# Patient Record
Sex: Male | Born: 1975 | Race: White | Hispanic: No | Smoking: Never smoker
Health system: Southern US, Community
[De-identification: ages and names within clinical notes are randomized; demographics above are authoritative.]

## PROBLEM LIST (undated history)

## (undated) DIAGNOSIS — I10 Essential (primary) hypertension: Secondary | ICD-10-CM

## (undated) DIAGNOSIS — G4733 Obstructive sleep apnea (adult) (pediatric): Secondary | ICD-10-CM

## (undated) DIAGNOSIS — R519 Headache, unspecified: Secondary | ICD-10-CM

## (undated) DIAGNOSIS — Z9989 Dependence on other enabling machines and devices: Secondary | ICD-10-CM

## (undated) DIAGNOSIS — K219 Gastro-esophageal reflux disease without esophagitis: Secondary | ICD-10-CM

## (undated) DIAGNOSIS — E119 Type 2 diabetes mellitus without complications: Secondary | ICD-10-CM

## (undated) HISTORY — PX: OTHER SURGICAL HISTORY: SHX169

## (undated) HISTORY — DX: Type 2 diabetes mellitus without complications: E11.9

## (undated) HISTORY — DX: Headache, unspecified: R51.9

---

## 2014-03-20 ENCOUNTER — Ambulatory Visit (HOSPITAL_COMMUNITY)
Admission: RE | Admit: 2014-03-20 | Discharge: 2014-03-20 | Disposition: A | Discharge status: Home / Self Care | Payer: BC Managed Care – PPO | Source: Ambulatory Visit | Attending: Family Medicine | Admitting: Family Medicine

## 2014-03-20 ENCOUNTER — Other Ambulatory Visit (HOSPITAL_COMMUNITY): Payer: Self-pay | Admitting: Family Medicine

## 2014-03-20 DIAGNOSIS — J069 Acute upper respiratory infection, unspecified: Secondary | ICD-10-CM

## 2014-03-20 DIAGNOSIS — R05 Cough: Secondary | ICD-10-CM | POA: Diagnosis not present

## 2014-03-20 DIAGNOSIS — R0989 Other specified symptoms and signs involving the circulatory and respiratory systems: Secondary | ICD-10-CM | POA: Diagnosis not present

## 2014-03-20 DIAGNOSIS — R509 Fever, unspecified: Secondary | ICD-10-CM | POA: Insufficient documentation

## 2014-07-14 ENCOUNTER — Emergency Department (HOSPITAL_COMMUNITY): Payer: BC Managed Care – PPO

## 2014-07-14 ENCOUNTER — Encounter (HOSPITAL_COMMUNITY): Payer: Self-pay | Admitting: Family Medicine

## 2014-07-14 ENCOUNTER — Other Ambulatory Visit (HOSPITAL_COMMUNITY): Payer: Self-pay

## 2014-07-14 ENCOUNTER — Observation Stay (HOSPITAL_COMMUNITY)
Admission: EM | Admit: 2014-07-14 | Discharge: 2014-07-15 | Disposition: A | Discharge status: Home / Self Care | Payer: BC Managed Care – PPO | Attending: Internal Medicine | Admitting: Internal Medicine

## 2014-07-14 DIAGNOSIS — K219 Gastro-esophageal reflux disease without esophagitis: Secondary | ICD-10-CM | POA: Insufficient documentation

## 2014-07-14 DIAGNOSIS — Z880 Allergy status to penicillin: Secondary | ICD-10-CM | POA: Insufficient documentation

## 2014-07-14 DIAGNOSIS — Z9981 Dependence on supplemental oxygen: Secondary | ICD-10-CM | POA: Insufficient documentation

## 2014-07-14 DIAGNOSIS — R61 Generalized hyperhidrosis: Secondary | ICD-10-CM | POA: Insufficient documentation

## 2014-07-14 DIAGNOSIS — E876 Hypokalemia: Secondary | ICD-10-CM

## 2014-07-14 DIAGNOSIS — G4733 Obstructive sleep apnea (adult) (pediatric): Secondary | ICD-10-CM | POA: Diagnosis not present

## 2014-07-14 DIAGNOSIS — R55 Syncope and collapse: Secondary | ICD-10-CM | POA: Diagnosis not present

## 2014-07-14 DIAGNOSIS — R42 Dizziness and giddiness: Secondary | ICD-10-CM | POA: Diagnosis present

## 2014-07-14 DIAGNOSIS — Z792 Long term (current) use of antibiotics: Secondary | ICD-10-CM | POA: Insufficient documentation

## 2014-07-14 DIAGNOSIS — R110 Nausea: Secondary | ICD-10-CM | POA: Diagnosis not present

## 2014-07-14 DIAGNOSIS — R739 Hyperglycemia, unspecified: Secondary | ICD-10-CM

## 2014-07-14 DIAGNOSIS — Z79899 Other long term (current) drug therapy: Secondary | ICD-10-CM | POA: Diagnosis not present

## 2014-07-14 DIAGNOSIS — D72829 Elevated white blood cell count, unspecified: Secondary | ICD-10-CM

## 2014-07-14 DIAGNOSIS — I10 Essential (primary) hypertension: Secondary | ICD-10-CM | POA: Diagnosis not present

## 2014-07-14 HISTORY — DX: Dependence on other enabling machines and devices: Z99.89

## 2014-07-14 HISTORY — DX: Essential (primary) hypertension: I10

## 2014-07-14 HISTORY — DX: Obstructive sleep apnea (adult) (pediatric): G47.33

## 2014-07-14 HISTORY — DX: Gastro-esophageal reflux disease without esophagitis: K21.9

## 2014-07-14 LAB — RAPID URINE DRUG SCREEN, HOSP PERFORMED
AMPHETAMINES: NOT DETECTED
Barbiturates: NOT DETECTED
Benzodiazepines: NOT DETECTED
Cocaine: NOT DETECTED
Opiates: NOT DETECTED
Tetrahydrocannabinol: NOT DETECTED

## 2014-07-14 LAB — CBC WITH DIFFERENTIAL/PLATELET
Basophils Absolute: 0.1 10*3/uL (ref 0.0–0.1)
Basophils Relative: 0 % (ref 0–1)
EOS ABS: 0.2 10*3/uL (ref 0.0–0.7)
EOS PCT: 1 % (ref 0–5)
HCT: 45.7 % (ref 39.0–52.0)
HEMOGLOBIN: 15.8 g/dL (ref 13.0–17.0)
LYMPHS ABS: 5.1 10*3/uL — AB (ref 0.7–4.0)
LYMPHS PCT: 27 % (ref 12–46)
MCH: 29.5 pg (ref 26.0–34.0)
MCHC: 34.6 g/dL (ref 30.0–36.0)
MCV: 85.4 fL (ref 78.0–100.0)
MONOS PCT: 9 % (ref 3–12)
Monocytes Absolute: 1.8 10*3/uL — ABNORMAL HIGH (ref 0.1–1.0)
NEUTROS ABS: 11.9 10*3/uL — AB (ref 1.7–7.7)
NEUTROS PCT: 63 % (ref 43–77)
Platelets: 385 10*3/uL (ref 150–400)
RBC: 5.35 MIL/uL (ref 4.22–5.81)
RDW: 13.5 % (ref 11.5–15.5)
WBC: 19.1 10*3/uL — AB (ref 4.0–10.5)

## 2014-07-14 LAB — COMPREHENSIVE METABOLIC PANEL
ALBUMIN: 4.7 g/dL (ref 3.5–5.2)
ALK PHOS: 72 U/L (ref 39–117)
ALT: 84 U/L — ABNORMAL HIGH (ref 0–53)
ANION GAP: 9 (ref 5–15)
AST: 37 U/L (ref 0–37)
BUN: 13 mg/dL (ref 6–23)
CALCIUM: 9.2 mg/dL (ref 8.4–10.5)
CHLORIDE: 106 mmol/L (ref 96–112)
CO2: 22 mmol/L (ref 19–32)
Creatinine, Ser: 1.24 mg/dL (ref 0.50–1.35)
GFR calc Af Amer: 84 mL/min — ABNORMAL LOW (ref >90)
GFR calc non Af Amer: 72 mL/min — ABNORMAL LOW (ref >90)
Glucose, Bld: 153 mg/dL — ABNORMAL HIGH (ref 70–99)
Potassium: 2.8 mmol/L — ABNORMAL LOW (ref 3.5–5.1)
Sodium: 137 mmol/L (ref 135–145)
TOTAL PROTEIN: 7.9 g/dL (ref 6.0–8.3)
Total Bilirubin: 0.5 mg/dL (ref 0.3–1.2)

## 2014-07-14 LAB — URINALYSIS, ROUTINE W REFLEX MICROSCOPIC
BILIRUBIN URINE: NEGATIVE
Glucose, UA: NEGATIVE mg/dL
Hgb urine dipstick: NEGATIVE
KETONES UR: NEGATIVE mg/dL
Leukocytes, UA: NEGATIVE
NITRITE: NEGATIVE
PROTEIN: NEGATIVE mg/dL
Specific Gravity, Urine: 1.02 (ref 1.005–1.030)
Urobilinogen, UA: 1 mg/dL (ref 0.0–1.0)
pH: 7.5 (ref 5.0–8.0)

## 2014-07-14 LAB — LACTIC ACID, PLASMA: LACTIC ACID, VENOUS: 2.1 mmol/L — AB (ref 0.5–2.0)

## 2014-07-14 LAB — TROPONIN I: Troponin I: <0.03 ng/mL (ref <0.031)

## 2014-07-14 MED ORDER — PREDNISONE 20 MG PO TABS: 60.0000 mg | ORAL_TABLET | Freq: Every day | ORAL | Status: DC

## 2014-07-14 MED ORDER — POTASSIUM CHLORIDE CRYS ER 20 MEQ PO TBCR
40.0000 meq | EXTENDED_RELEASE_TABLET | Freq: Four times a day (QID) | ORAL | Status: AC
  Administered 2014-07-14 – 2014-07-15 (×2): 40 meq via ORAL
  Filled 2014-07-14 (×2): qty 2

## 2014-07-14 MED ORDER — PANTOPRAZOLE SODIUM 40 MG PO TBEC: 40.0000 mg | DELAYED_RELEASE_TABLET | Freq: Every day | ORAL | Status: DC

## 2014-07-14 MED ORDER — SODIUM CHLORIDE 0.9 % IJ SOLN
3.0000 mL | Freq: Two times a day (BID) | INTRAMUSCULAR | Status: DC
  Administered 2014-07-15: 3 mL via INTRAVENOUS

## 2014-07-14 MED ORDER — SODIUM CHLORIDE 0.45 % IV SOLN
INTRAVENOUS | Status: DC
  Administered 2014-07-14 – 2014-07-15 (×2): via INTRAVENOUS

## 2014-07-14 MED ORDER — MECLIZINE HCL 12.5 MG PO TABS
25.0000 mg | ORAL_TABLET | Freq: Three times a day (TID) | ORAL | Status: DC | PRN
  Administered 2014-07-15: 25 mg via ORAL
  Filled 2014-07-14: qty 2

## 2014-07-14 MED ORDER — ONDANSETRON HCL 4 MG PO TABS: 4.0000 mg | ORAL_TABLET | Freq: Four times a day (QID) | ORAL | Status: DC | PRN

## 2014-07-14 MED ORDER — PREDNISONE 20 MG PO TABS
60.0000 mg | ORAL_TABLET | Freq: Every day | ORAL | Status: DC
  Administered 2014-07-15: 60 mg via ORAL
  Filled 2014-07-14: qty 3

## 2014-07-14 MED ORDER — ONDANSETRON HCL 4 MG/2ML IJ SOLN: 4.0000 mg | Freq: Four times a day (QID) | INTRAMUSCULAR | Status: DC | PRN

## 2014-07-14 MED ORDER — MECLIZINE HCL 12.5 MG PO TABS
25.0000 mg | ORAL_TABLET | Freq: Once | ORAL | Status: AC
  Administered 2014-07-14: 25 mg via ORAL
  Filled 2014-07-14: qty 2

## 2014-07-14 MED ORDER — ACETAMINOPHEN 325 MG PO TABS: 650.0000 mg | ORAL_TABLET | Freq: Four times a day (QID) | ORAL | Status: DC | PRN

## 2014-07-14 MED ORDER — PANTOPRAZOLE SODIUM 40 MG PO TBEC
40.0000 mg | DELAYED_RELEASE_TABLET | Freq: Every day | ORAL | Status: DC
  Administered 2014-07-15: 40 mg via ORAL
  Filled 2014-07-14: qty 1

## 2014-07-14 MED ORDER — SODIUM CHLORIDE 0.9 % IV BOLUS (SEPSIS)
1000.0000 mL | Freq: Once | INTRAVENOUS | Status: AC
  Administered 2014-07-14: 1000 mL via INTRAVENOUS

## 2014-07-14 MED ORDER — ONDANSETRON HCL 4 MG/2ML IJ SOLN
4.0000 mg | Freq: Once | INTRAMUSCULAR | Status: AC
  Administered 2014-07-14: 4 mg via INTRAVENOUS
  Filled 2014-07-14: qty 2

## 2014-07-14 MED ORDER — HEPARIN SODIUM (PORCINE) 5000 UNIT/ML IJ SOLN
5000.0000 [IU] | Freq: Three times a day (TID) | INTRAMUSCULAR | Status: DC
  Filled 2014-07-14: qty 1

## 2014-07-14 MED ORDER — ACETAMINOPHEN 650 MG RE SUPP: 650.0000 mg | Freq: Four times a day (QID) | RECTAL | Status: DC | PRN

## 2014-07-14 NOTE — ED Provider Notes (Signed)
CSN: [id number]     Arrival date & time 07/14/14  1509 History  This chart was scribed for Francis OctaveStephen Hermina Barnard, MD by Ronney LionSuzanne Le, ED Scribe. This patient was seen in room [facility]10/[facility]10 and the patient's care was started at 3:25 PM.    No chief complaint on file.  The history is provided by the patient and a relative. No language interpreter was used.     HPI Comments: Francis ShipperJames T Morr Jr. is a 39 y.o. male who presents to the Emergency Department complaining of constant, sudden onset room-spinning and light-headed dizziness that occurred at work 1 hour ago when patient was standing from sitting down. He states he also felt hot, sweaty, and nauseated. He states that right now, his left ear "feels weird" which started with this episode and he has some pain in his left sinus area. His symptoms right now feel the same as when they started. Patient went here right after work, and had no treatment PTA. He is on his 3rd day of a Z-pak for sinus issues, per family member; no other recent medication changes. Family reports a history of hypertension, GERD,  hiatal hernia, and asthma. Patient is on lisinopril and a betablocker, per family member. He denies a history of DM. He denies headache or any other pain, abdominal pain, chest pain, visual changes, cough, SOB, or near-syncope. Patient denies drug use or EtOH consumption. Dr. Phillips OdorGolding is patient's PCP. Patient has known allergies to penicillin.   Past Medical History  Diagnosis Date  . GERD (gastroesophageal reflux disease)   . HTN (hypertension)   . OSA on CPAP    Past Surgical History  Procedure Laterality Date  . Wisdom teeth removal      Family History  Problem Relation Age of Onset  . Hypertension Father    History  Substance Use Topics  . Smoking status: Never Smoker   . Smokeless tobacco: Not on file  . Alcohol Use: No    Review of Systems  A complete 10 system review of systems was obtained and all systems are negative except as noted in  the HPI and PMH.    Allergies  Penicillins  Home Medications   Prior to Admission medications   Medication Sig Start Date End Date Taking? Authorizing Provider  azelastine (ASTELIN) 0.1 % nasal spray Place 1 spray into both nostrils 2 (two) times daily. Use in each nostril as directed   Yes Historical Provider, MD  azithromycin (ZITHROMAX) 250 MG tablet Take 250 mg by mouth daily. COURSE STARTING ON 07/10/2013--TO TAKE TWO TABLETS ON DAY 1 THEN TAKE ONE TABLET DAILY FOR 4 DAYS 07/10/14  Yes Historical Provider, MD  ibuprofen (ADVIL,MOTRIN) 200 MG tablet Take 400 mg by mouth every 6 (six) hours as needed for mild pain or moderate pain.   Yes Historical Provider, MD  lisinopril-hydrochlorothiazide (PRINZIDE,ZESTORETIC) 10-12.5 MG per tablet Take 1 tablet by mouth every morning.   Yes Historical Provider, MD  metoprolol succinate (TOPROL-XL) 50 MG 24 hr tablet Take 25 mg by mouth at bedtime. 06/05/14  Yes Historical Provider, MD  Multiple Vitamin (MULTIVITAMIN WITH MINERALS) TABS tablet Take 1 tablet by mouth every evening.   Yes Historical Provider, MD  omeprazole (PRILOSEC) 20 MG capsule Take 20 mg by mouth daily.   Yes Historical Provider, MD  triamcinolone cream (KENALOG) 0.1 % Apply 1 application topically 2 (two) times daily as needed (for psoriasis).   Yes Historical Provider, MD   BP 155/87 mmHg  Pulse 81  Temp(Src)  98.9 F (37.2 C) (Oral)  Resp 16  Ht  (1.651 m)  Wt 264 lb (119.75 kg)  BMI 43.93 kg/m2  SpO2 97% Physical Exam  Constitutional: He is oriented to person, place, and time. He appears well-developed and well-nourished. No distress.  Diaphoretic, uncomfortable.   HENT:  Head: Normocephalic and atraumatic.  Mouth/Throat: Oropharynx is clear and moist. No oropharyngeal exudate.  Eyes: Conjunctivae and EOM are normal. Pupils are equal, round, and reactive to light.  Neck: Normal range of motion. Neck supple.  No meningismus.  Cardiovascular: Normal rate, regular  rhythm, normal heart sounds and intact distal pulses.   No murmur heard. Pulmonary/Chest: Effort normal and breath sounds normal. No respiratory distress.  Abdominal: Soft. There is no tenderness. There is no rebound and no guarding.  Musculoskeletal: Normal range of motion. He exhibits no edema or tenderness.  Neurological: He is alert and oriented to person, place, and time. No cranial nerve deficit. He exhibits normal muscle tone. Coordination normal.  No ataxia on finger to nose bilaterally. No pronator drift. 5/5 strength throughout. CN 2-12 intact. Equal grip strength. Sensation intact. No nystagmus. Test-of-skew negative. Head-impulse test negative.   Skin: Skin is warm.  Psychiatric: He has a normal mood and affect. His behavior is normal.  Nursing note and vitals reviewed.   ED Course  Procedures (including critical care time)  DIAGNOSTIC STUDIES: Oxygen Saturation is 100% on room air, normal by my interpretation.    COORDINATION OF CARE: 3:34 PM - Discussed treatment plan with pt at bedside which includes blood tests and other diagnostic tests, and pt agreed to plan.   Labs Review Labs Reviewed  CBC WITH DIFFERENTIAL/PLATELET - Abnormal; Notable for the following:    WBC 19.1 (*)    Neutro Abs 11.9 (*)    Lymphs Abs 5.1 (*)    Monocytes Absolute 1.8 (*)    All other components within normal limits  COMPREHENSIVE METABOLIC PANEL - Abnormal; Notable for the following:    Potassium 2.8 (*)    Glucose, Bld 153 (*)    ALT 84 (*)    GFR calc non Af Amer 72 (*)    GFR calc Af Amer 84 (*)    All other components within normal limits  LACTIC ACID, PLASMA - Abnormal; Notable for the following:    Lactic Acid, Venous 2.1 (*)    All other components within normal limits  TROPONIN I  URINALYSIS, ROUTINE W REFLEX MICROSCOPIC  URINE RAPID DRUG SCREEN (HOSP PERFORMED)  TROPONIN I  CBC  COMPREHENSIVE METABOLIC PANEL  TROPONIN I  HEMOGLOBIN A1C  LACTIC ACID, PLASMA  CBG  MONITORING, ED  CBG MONITORING, ED    Imaging Review Ct Head Wo Contrast  07/14/2014   CLINICAL DATA:  Sudden onset of lightheadedness, dizziness.  EXAM: CT HEAD WITHOUT CONTRAST  TECHNIQUE: Contiguous axial images were obtained from the base of the skull through the vertex without intravenous contrast.  COMPARISON:  None.  FINDINGS: No acute intracranial abnormality. Specifically, no hemorrhage, hydrocephalus, mass lesion, acute infarction, or significant intracranial injury. No acute calvarial abnormality. Visualized paranasal sinuses and mastoids clear. Orbital soft tissues unremarkable.  IMPRESSION: Negative.   Electronically Signed   By: Charlett Nose M.D.   On: 07/14/2014 16:43   Dg Chest Portable 1 View  07/14/2014   CLINICAL DATA:  39 year old male with diaphoresis nausea, dizziness, syncope. Initial encounter.  EXAM: PORTABLE CHEST - 1 VIEW  COMPARISON:  03/20/2014 and earlier.  FINDINGS: Portable AP supine  view at 1554 hours. Lower lung volumes. Allowing for portable technique, the lungs are clear. Normal cardiac size and mediastinal contours. No pneumothorax or plural effusion identified. Chronic left lateral seventh and eighth rib fractures.  IMPRESSION: No acute cardiopulmonary abnormality.   Electronically Signed   By: Odessa Fleming M.D.   On: 07/14/2014 16:14     EKG Interpretation   Date/Time:  Monday July 14 2014 15:36:28 EST Ventricular Rate:  74 PR Interval:  171 QRS Duration: 110 QT Interval:  423 QTC Calculation: 469 R Axis:   49 Text Interpretation:  Sinus rhythm No previous ECGs available Confirmed by  Manus Gunning  MD, Roxie Kreeger [phone]) on 07/14/2014 3:46:50 PM      MDM   Final diagnoses:  Near syncope  Diaphoresis   acute onset of diaphoresis, nausea, lightheadedness, room spinning dizziness that onset while getting up from a sitting position. No chest pain, back pain or abdominal pain. CBG 134. He denies skipping meals.  EKG normal sinus rhythm.  orthostatics  negative. Chest x-ray negative. Leukocytosis on CBC with recent treatment for URI.   Patient feeling improved after IV fluids, meclizine and Zofran in the ED. His symptoms are reproducible with head movement and sitting up. Continues to deny chest pain. Troponin negative.  Patient with acute onset of near syncope with dizziness and severe diaphoresis. No hypoglycemia. No cardiopulmonary symptoms. Suspect related to peripheral vertigo though patient with severe diaphoresis on arrival. Will admit for observation given initial concerning presentation. D/w Dr. Konrad Dolores.  I personally performed the services described in this documentation, which was scribed in my presence. The recorded information has been reviewed and is accurate.    Francis Octave, MD 07/15/14 [phone]

## 2014-07-14 NOTE — ED Notes (Addendum)
Onset at 2:30 was at work, diaphoretic, nausea, dizziness, pt is is sweating profusely in triage,  And pale, taken back to room to  Complete triage

## 2014-07-14 NOTE — ED Notes (Signed)
Pt tolerated fluids well.  

## 2014-07-14 NOTE — ED Notes (Signed)
CBG 134 at triage.

## 2014-07-14 NOTE — ED Notes (Signed)
Pt diaphoretic in triage, pt taken straight to room.

## 2014-07-14 NOTE — H&P (Signed)
[facility] History and Physical  Francis ShipperJames T Balandran Jr. ZOX:[id number] DOB: 08/27/1975 DOA: 07/14/2014  Referring physician: Dr. Manus Gunningancour - [facility] PCP: Colette RibasGOLDING, [name], MD   Chief Complaint: dizziness  HPI: Francis ShipperJames T Brazie Jr. is a 39 y.o. male  Talking to coworker and developed sudden onset nausea, lightheadedness, diaphoresis. Occurred when stood up to leave room. Went back to desk and sat down. Took > 3.5 hrs to resolve. Rested w/ improvement. Worse w/ sitting, standing or ambulation. Denies syncope chest pain, palpitations, headache, numbness, tingling, unilateral or bilateral weakness.. 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  Started on Azithro last week for Sinusitis/uri/Ear fullness   Review of Systems:  Constitutional:  No weight loss, night sweats, Fevers, chills, fatigue.  HEENT:  Per HPI Cardio-vascular:  No chest pain, Orthopnea, PND, swelling in lower extremities, anasarca, palpitations  GI:  No heartburn, indigestion, abdominal pain, nausea, vomiting, diarrhea, change in bowel habits, loss of appetite  Resp:   No shortness of breath with exertion or at rest. No excess mucus, no productive cough, No non-productive cough, No coughing up of blood.No change in color of mucus.No wheezing.No chest wall deformity  Skin:  no rash or lesions.  GU:  no dysuria, change in color of urine, no urgency or frequency. No flank pain.  Musculoskeletal:   No joint pain or swelling. No decreased range of motion. No back pain.  Psych:  No change in mood or affect. No depression or anxiety. No memory loss.   Past Medical History  Diagnosis Date  . GERD (gastroesophageal reflux disease)   . HTN (hypertension)   . OSA on CPAP    No past surgical history on file. Social History:  reports that he does not drink alcohol or use illicit drugs. His tobacco history is not on file.  Allergies  Allergen Reactions  . Penicillins Hives    Family History  Problem Relation Age of Onset    . Hypertension Father      Prior to Admission medications   Medication Sig Start Date End Date Taking? Authorizing Provider  azelastine (ASTELIN) 0.1 % nasal spray Place 1 spray into both nostrils 2 (two) times daily. Use in each nostril as directed   Yes Historical Provider, MD  azithromycin (ZITHROMAX) 250 MG tablet Take 250 mg by mouth daily. COURSE STARTING ON 07/10/2013--TO TAKE TWO TABLETS ON DAY 1 THEN TAKE ONE TABLET DAILY FOR 4 DAYS 07/10/14  Yes Historical Provider, MD  ibuprofen (ADVIL,MOTRIN) 200 MG tablet Take 400 mg by mouth every 6 (six) hours as needed for mild pain or moderate pain.   Yes Historical Provider, MD  lisinopril-hydrochlorothiazide (PRINZIDE,ZESTORETIC) 10-12.5 MG per tablet Take 1 tablet by mouth every morning.   Yes Historical Provider, MD  metoprolol succinate (TOPROL-XL) 50 MG 24 hr tablet Take 25 mg by mouth at bedtime. 06/05/14  Yes Historical Provider, MD  Multiple Vitamin (MULTIVITAMIN WITH MINERALS) TABS tablet Take 1 tablet by mouth every evening.   Yes Historical Provider, MD  omeprazole (PRILOSEC) 20 MG capsule Take 20 mg by mouth daily.   Yes Historical Provider, MD  triamcinolone cream (KENALOG) 0.1 % Apply 1 application topically 2 (two) times daily as needed (for psoriasis).   Yes Historical Provider, MD   Physical Exam: Filed Vitals:   07/14/14 1850 07/14/14 1900 07/14/14 1915 07/14/14 1930  BP:  127/70 122/68 127/72  Pulse:  77 78 78  Temp:    98.2 F (36.8 C)  TempSrc:    Oral  Resp:  SpO2: 100%   97%    Wt Readings from Last 3 Encounters:  No data found for Wt    General:  Appears calm and comfortable Eyes:  PERRL, normal lids, irises & conjunctiva ENT:  grossly normal hearing, lips & tongue Neck:  no LAD, masses or thyromegaly Cardiovascular:  RRR, no m/r/g. No LE edema. Telemetry:  SR, no arrhythmias  Respiratory:  CTA bilaterally, no w/r/r. Normal respiratory effort. Abdomen:  soft, ntnd Skin:  no rash or induration  seen on limited exam Musculoskeletal:  grossly normal tone BUE/BLE Psychiatric:  grossly normal mood and affect, speech fluent and appropriate Neurologic:  Upon sitting pt became symptomatic which worsened w/ head turning. Dix Hallpike equivocal. CN 2-12 intact. Sits unassisted, moves all extremities in coordinated fashion          Labs on Admission:  Basic Metabolic Panel:  Recent Labs Lab 07/14/14 1530  NA 137  K 2.8*  CL 106  CO2 22  GLUCOSE 153*  BUN 13  CREATININE 1.24  CALCIUM 9.2   Liver Function Tests:  Recent Labs Lab 07/14/14 1530  AST 37  ALT 84*  ALKPHOS 72  BILITOT 0.5  PROT 7.9  ALBUMIN 4.7   No results for input(s): LIPASE, AMYLASE in the last 168 hours. No results for input(s): AMMONIA in the last 168 hours. CBC:  Recent Labs Lab 07/14/14 1530  WBC 19.1*  NEUTROABS 11.9*  HGB 15.8  HCT 45.7  MCV 85.4  PLT 385   Cardiac Enzymes:  Recent Labs Lab 07/14/14 1530  TROPONINI <0.03    BNP (last 3 results) No results for input(s): BNP in the last 8760 hours.  ProBNP (last 3 results) No results for input(s): PROBNP in the last 8760 hours.  CBG: No results for input(s): GLUCAP in the last 168 hours.  Radiological Exams on Admission: Ct Head Wo Contrast  07/14/2014   CLINICAL DATA:  Sudden onset of lightheadedness, dizziness.  EXAM: CT HEAD WITHOUT CONTRAST  TECHNIQUE: Contiguous axial images were obtained from the base of the skull through the vertex without intravenous contrast.  COMPARISON:  None.  FINDINGS: No acute intracranial abnormality. Specifically, no hemorrhage, hydrocephalus, mass lesion, acute infarction, or significant intracranial injury. No acute calvarial abnormality. Visualized paranasal sinuses and mastoids clear. Orbital soft tissues unremarkable.  IMPRESSION: Negative.   Electronically Signed   By: Charlett Nose M.D.   On: 07/14/2014 16:43   Dg Chest Portable 1 View  07/14/2014   CLINICAL DATA:  39 year old male with  diaphoresis nausea, dizziness, syncope. Initial encounter.  EXAM: PORTABLE CHEST - 1 VIEW  COMPARISON:  03/20/2014 and earlier.  FINDINGS: Portable AP supine view at 1554 hours. Lower lung volumes. Allowing for portable technique, the lungs are clear. Normal cardiac size and mediastinal contours. No pneumothorax or plural effusion identified. Chronic left lateral seventh and eighth rib fractures.  IMPRESSION: No acute cardiopulmonary abnormality.   Electronically Signed   By: Odessa Fleming M.D.   On: 07/14/2014 16:14    EKG: Independently reviewed. NSR, no ACS  Assessment/Plan Principal Problem:   Near syncope Active Problems:   Essential hypertension   Hyperglycemia   Leukocytosis   Hypokalemia  Near syncopal episode: labyrinthitis vs arrythmia vs orthostasis vs other infectious process vs hypok. Orthostatics emergency room normal. Symptoms improved w/ 1L NS bolus, zofran and MEclizine. CT head and CXR nml.  - Admit for obs - Tele - 1/2NS 124ml/hr - Meclizine and zofran - cycle  troponin - Prednisone 60  HTN: normotensive on admission - hold Lisinopril, HCTZ, and metoprolol - consider weaning or stopping HCTZ on discharge given symptoms and hypokalemia  GERD: - continue ppi  Hyperglycemia: stress induced vs metabolic disorder - A1c  Leukocytosis: stress induced vs infection. Pt just coming off ABX for URI. No complaints of acute infection. Afebrile VSS. CXR and CT head nml.  - CBC in am - Lactic acid  Hypokalemia: 2.8 on admission. On HCTZ - Kdur x2  Code Status: FULL DVT Prophylaxis: Hep Family Communication: Mother and wife Disposition Plan: pending obs/improvement.     MERRELL, Elmon Else, MD Family Medicine [facility] [email or web address] Password [email or web address]

## 2014-07-14 NOTE — ED Notes (Signed)
Pt ambulated with assitance, walked 25 feet and stated was dizzy, requested to go back to room.

## 2014-07-15 DIAGNOSIS — R42 Dizziness and giddiness: Secondary | ICD-10-CM

## 2014-07-15 LAB — CBC
HEMATOCRIT: 40.7 % (ref 39.0–52.0)
Hemoglobin: 13.9 g/dL (ref 13.0–17.0)
MCH: 29.8 pg (ref 26.0–34.0)
MCHC: 34.2 g/dL (ref 30.0–36.0)
MCV: 87.2 fL (ref 78.0–100.0)
Platelets: 269 10*3/uL (ref 150–400)
RBC: 4.67 MIL/uL (ref 4.22–5.81)
RDW: 13.5 % (ref 11.5–15.5)
WBC: 15.9 10*3/uL — ABNORMAL HIGH (ref 4.0–10.5)

## 2014-07-15 LAB — COMPREHENSIVE METABOLIC PANEL
ALBUMIN: 3.9 g/dL (ref 3.5–5.2)
ALK PHOS: 62 U/L (ref 39–117)
ALT: 72 U/L — ABNORMAL HIGH (ref 0–53)
AST: 28 U/L (ref 0–37)
Anion gap: 5 (ref 5–15)
BUN: 11 mg/dL (ref 6–23)
CHLORIDE: 108 mmol/L (ref 96–112)
CO2: 26 mmol/L (ref 19–32)
CREATININE: 0.97 mg/dL (ref 0.50–1.35)
Calcium: 8.8 mg/dL (ref 8.4–10.5)
GFR calc Af Amer: >90 mL/min (ref >90)
GFR calc non Af Amer: >90 mL/min (ref >90)
Glucose, Bld: 113 mg/dL — ABNORMAL HIGH (ref 70–99)
POTASSIUM: 3.8 mmol/L (ref 3.5–5.1)
Sodium: 139 mmol/L (ref 135–145)
Total Bilirubin: 0.6 mg/dL (ref 0.3–1.2)
Total Protein: 6.7 g/dL (ref 6.0–8.3)

## 2014-07-15 LAB — TROPONIN I

## 2014-07-15 MED ORDER — MECLIZINE HCL 25 MG PO TABS: 25.0000 mg | ORAL_TABLET | Freq: Two times a day (BID) | ORAL | Status: DC | PRN

## 2014-07-15 MED ORDER — POTASSIUM CHLORIDE ER 10 MEQ PO TBCR: 10.0000 meq | EXTENDED_RELEASE_TABLET | Freq: Every day | ORAL | Status: DC

## 2014-07-15 MED ORDER — ONDANSETRON HCL 4 MG PO TABS: 4.0000 mg | ORAL_TABLET | Freq: Four times a day (QID) | ORAL | Status: DC | PRN

## 2014-07-15 NOTE — Progress Notes (Signed)
Patient discharged home with wife.  IV removed - WNL.  Instructed to make follow up appointment in 1 week with PCP.  Reviewed medications and new prescriptions.  Instructed to change positions slowly to prevent dizziness upon standing.  Verbalizes understanding of DC instructions.  STable to DC, no questions at this time.  Ambulatory off unit with RN assist,.

## 2014-07-15 NOTE — Discharge Instructions (Signed)

## 2014-07-15 NOTE — Discharge Summary (Signed)
Physician Discharge Summary  Francis Butler. ZOX:[id number] DOB: [birth year] DOA: 07/14/2014  PCP: Francis Ribas, MD  Admit date: 07/14/2014 Discharge date: 07/15/2014  Recommendations for Outpatient Follow-up:  1. Check CBC and BMP during next office visit.  Discharge Diagnoses:  Principal Problem:   Near syncope Active Problems:   Essential hypertension   Hyperglycemia   Leukocytosis   Hypokalemia    Discharge Condition: stable   Diet recommendation: as tolerated   History of present illness:  39 y.o. male with history of hypertension who presented to [facility] with complaints of near syncope when he attempted to stand up. He reported the feeling as "if room was spinning around". No reports of lightheadedness or weakness. He did reports having chronic sinus and ear congestion (was on azithromycin prior to admission for that). No other complaints of the admission.  Hospital Course:   Principal Problem:   Near syncope / vertigo - likely vertigo based on patient's description of room spinning around - CT head negative for acute intracranial findings. - feels better this am - provided meclizine PRN on discharge   Active Problems:   Essential hypertension - may resume home meds    Leukocytosis - possibly from sinusitis - was on z-pack prior to admission    Hypokalemia - from BP med which has diuretic component  - supplemented    Signed:  Manson Passey, MD  [facility] 07/15/2014, 10:06 AM  Pager #: [phone]    Discharge Exam: Filed Vitals:   07/15/14 0556  BP: 127/69  Pulse: 78  Temp: 98.5 F (36.9 C)  Resp: 20   Filed Vitals:   07/14/14 1915 07/14/14 1930 07/14/14 2057 07/15/14 0556  BP: 122/68 127/72 155/87 127/69  Pulse: 78 78 81 78  Temp:  98.2 F (36.8 C) 98.9 F (37.2 C) 98.5 F (36.9 C)  TempSrc:  Oral Oral Oral  Resp: Height:    (1.651 m)   Weight:   119.75 kg (264 lb)   SpO2:  97% 97% 100%    General:  Pt is alert, follows commands appropriately, not in acute distress Cardiovascular: Regular rate and rhythm, S1/S2 +, no murmurs Respiratory: Clear to auscultation bilaterally, no wheezing, no crackles, no rhonchi Abdominal: Soft, non tender, non distended, bowel sounds +, no guarding Extremities: no edema, no cyanosis, pulses palpable bilaterally DP and PT Neuro: Grossly nonfocal  Discharge Instructions  Discharge Instructions    Call MD for:  difficulty breathing, headache or visual disturbances    Complete by:  As directed      Call MD for:  redness, tenderness, or signs of infection (pain, swelling, redness, odor or green/yellow discharge around incision site)    Complete by:  As directed      Call MD for:  severe uncontrolled pain    Complete by:  As directed      Diet - low sodium heart healthy    Complete by:  As directed      Increase activity slowly    Complete by:  As directed             Medication List    STOP taking these medications        azithromycin 250 MG tablet  Commonly known as:  ZITHROMAX      TAKE these medications        azelastine 0.1 % nasal spray  Commonly known as:  ASTELIN  Place 1 spray into both nostrils  2 (two) times daily. Use in each nostril as directed     ibuprofen 200 MG tablet  Commonly known as:  ADVIL,MOTRIN  Take 400 mg by mouth every 6 (six) hours as needed for mild pain or moderate pain.     lisinopril-hydrochlorothiazide 10-12.5 MG per tablet  Commonly known as:  PRINZIDE,ZESTORETIC  Take 1 tablet by mouth every morning.     meclizine 25 MG tablet  Commonly known as:  ANTIVERT  Take 1 tablet (25 mg total) by mouth 2 (two) times daily as needed for dizziness.     metoprolol succinate 50 MG 24 hr tablet  Commonly known as:  TOPROL-XL  Take 25 mg by mouth at bedtime.     multivitamin with minerals Tabs tablet  Take 1 tablet by mouth every evening.     omeprazole 20 MG capsule  Commonly known as:  PRILOSEC  Take 20 mg  by mouth daily.     ondansetron 4 MG tablet  Commonly known as:  ZOFRAN  Take 1 tablet (4 mg total) by mouth every 6 (six) hours as needed for nausea.     potassium chloride 10 MEQ tablet  Commonly known as:  K-DUR  Take 1 tablet (10 mEq total) by mouth daily.     triamcinolone cream 0.1 %  Commonly known as:  KENALOG  Apply 1 application topically 2 (two) times daily as needed (for psoriasis).           Follow-up Information    Follow up with Francis Ribas, MD. Schedule an appointment as soon as possible for a visit in 1 week.   Specialty:  Family Medicine   Why:  Follow up appt after recent hospitalization   Contact information:   [address] [place] [address] [phone]        The results of significant diagnostics from this hospitalization (including imaging, microbiology, ancillary and laboratory) are listed below for reference.    Significant Diagnostic Studies: Ct Head Wo Contrast  07/14/2014   CLINICAL DATA:  Sudden onset of lightheadedness, dizziness.  EXAM: CT HEAD WITHOUT CONTRAST  TECHNIQUE: Contiguous axial images were obtained from the base of the skull through the vertex without intravenous contrast.  COMPARISON:  None.  FINDINGS: No acute intracranial abnormality. Specifically, no hemorrhage, hydrocephalus, mass lesion, acute infarction, or significant intracranial injury. No acute calvarial abnormality. Visualized paranasal sinuses and mastoids clear. Orbital soft tissues unremarkable.  IMPRESSION: Negative.   Electronically Signed   By: Francis Butler M.D.   On: 07/14/2014 16:43   Dg Chest Portable 1 View  07/14/2014   CLINICAL DATA:  39 year old male with diaphoresis nausea, dizziness, syncope. Initial encounter.  EXAM: PORTABLE CHEST - 1 VIEW  COMPARISON:  03/20/2014 and earlier.  FINDINGS: Portable AP supine view at 1554 hours. Lower lung volumes. Allowing for portable technique, the lungs are clear. Normal cardiac size and mediastinal  contours. No pneumothorax or plural effusion identified. Chronic left lateral seventh and eighth rib fractures.  IMPRESSION: No acute cardiopulmonary abnormality.   Electronically Signed   By: Francis Butler M.D.   On: 07/14/2014 16:14    Microbiology: No results found for this or any previous visit (from the past 240 hour(s)).   Labs: Basic Metabolic Panel:  Recent Labs Lab 07/14/14 1530 07/15/14 0158  NA 137 139  K 2.8* 3.8  CL 106 108  CO2 22 26  GLUCOSE 153* 113*  BUN 13 11  CREATININE 1.24 0.97  CALCIUM 9.2 8.8  Liver Function Tests:  Recent Labs Lab 07/14/14 1530 07/15/14 0158  AST 37 28  ALT 84* 72*  ALKPHOS 72 62  BILITOT 0.5 0.6  PROT 7.9 6.7  ALBUMIN 4.7 3.9   No results for input(s): LIPASE, AMYLASE in the last 168 hours. No results for input(s): AMMONIA in the last 168 hours. CBC:  Recent Labs Lab 07/14/14 1530 07/15/14 0158  WBC 19.1* 15.9*  NEUTROABS 11.9*  --   HGB 15.8 13.9  HCT 45.7 40.7  MCV 85.4 87.2  PLT 385 269   Cardiac Enzymes:  Recent Labs Lab 07/14/14 1530 07/14/14 2039 07/15/14 0158  TROPONINI <0.03 <0.03 <0.03   BNP: BNP (last 3 results) No results for input(s): BNP in the last 8760 hours.  ProBNP (last 3 results) No results for input(s): PROBNP in the last 8760 hours.  CBG: No results for input(s): GLUCAP in the last 168 hours.  Time coordinating discharge: Over 30 minutes

## 2014-07-15 NOTE — Progress Notes (Signed)
UR completed 

## 2014-07-16 LAB — GLUCOSE, CAPILLARY: GLUCOSE-CAPILLARY: 134 mg/dL — AB (ref 70–99)

## 2014-07-16 LAB — HEMOGLOBIN A1C
Hgb A1c MFr Bld: 5.7 % — ABNORMAL HIGH (ref 4.8–5.6)
Mean Plasma Glucose: 117 mg/dL

## 2014-10-15 ENCOUNTER — Other Ambulatory Visit (INDEPENDENT_AMBULATORY_CARE_PROVIDER_SITE_OTHER): Payer: Self-pay | Admitting: Otolaryngology

## 2014-10-15 DIAGNOSIS — R42 Dizziness and giddiness: Secondary | ICD-10-CM

## 2014-10-15 DIAGNOSIS — H918X9 Other specified hearing loss, unspecified ear: Secondary | ICD-10-CM

## 2014-10-28 ENCOUNTER — Ambulatory Visit
Admission: RE | Admit: 2014-10-28 | Discharge: 2014-10-28 | Disposition: A | Discharge status: Home / Self Care | Payer: BC Managed Care – PPO | Source: Ambulatory Visit | Attending: Otolaryngology | Admitting: Otolaryngology

## 2014-10-28 DIAGNOSIS — R42 Dizziness and giddiness: Secondary | ICD-10-CM

## 2014-10-28 DIAGNOSIS — H918X9 Other specified hearing loss, unspecified ear: Secondary | ICD-10-CM

## 2014-10-28 MED ORDER — GADOBENATE DIMEGLUMINE 529 MG/ML IV SOLN
20.0000 mL | Freq: Once | INTRAVENOUS | Status: AC | PRN
  Administered 2014-10-28: 20 mL via INTRAVENOUS

## 2014-10-28 MED ORDER — GADOBENATE DIMEGLUMINE 529 MG/ML IV SOLN: 20.0000 mL | Freq: Once | INTRAVENOUS | Status: DC | PRN

## 2014-11-27 ENCOUNTER — Ambulatory Visit (INDEPENDENT_AMBULATORY_CARE_PROVIDER_SITE_OTHER): Payer: BC Managed Care – PPO | Admitting: Otolaryngology

## 2015-03-12 ENCOUNTER — Ambulatory Visit (INDEPENDENT_AMBULATORY_CARE_PROVIDER_SITE_OTHER): Payer: BC Managed Care – PPO | Admitting: Otolaryngology

## 2015-03-12 DIAGNOSIS — H8101 Meniere's disease, right ear: Secondary | ICD-10-CM | POA: Diagnosis not present

## 2015-03-12 DIAGNOSIS — H9311 Tinnitus, right ear: Secondary | ICD-10-CM | POA: Diagnosis not present

## 2015-03-12 DIAGNOSIS — H9041 Sensorineural hearing loss, unilateral, right ear, with unrestricted hearing on the contralateral side: Secondary | ICD-10-CM | POA: Diagnosis not present

## 2019-08-05 ENCOUNTER — Other Ambulatory Visit: Payer: Self-pay

## 2019-08-05 ENCOUNTER — Ambulatory Visit: Payer: BC Managed Care – PPO | Attending: Family Medicine

## 2019-08-05 DIAGNOSIS — Z20822 Contact with and (suspected) exposure to covid-19: Secondary | ICD-10-CM

## 2019-08-06 LAB — NOVEL CORONAVIRUS, NAA: SARS-CoV-2, NAA: NOT DETECTED

## 2019-10-02 ENCOUNTER — Other Ambulatory Visit: Payer: Self-pay

## 2019-10-02 ENCOUNTER — Encounter: Payer: Self-pay | Admitting: Neurology

## 2019-10-02 ENCOUNTER — Ambulatory Visit: Payer: BC Managed Care – PPO | Admitting: Neurology

## 2019-10-02 VITALS — BP 142/93 | HR 112 | Temp 97.2°F | Ht 65.0 in | Wt 249.0 lb

## 2019-10-02 DIAGNOSIS — R42 Dizziness and giddiness: Secondary | ICD-10-CM

## 2019-10-02 DIAGNOSIS — G43519 Persistent migraine aura without cerebral infarction, intractable, without status migrainosus: Secondary | ICD-10-CM | POA: Insufficient documentation

## 2019-10-02 MED ORDER — INDOMETHACIN ER 75 MG PO CPCR: 75.0000 mg | ORAL_CAPSULE | Freq: Two times a day (BID) | ORAL | 1 refills | Status: DC

## 2019-10-02 NOTE — Progress Notes (Signed)
Reason for visit: Facial pain, vertigo  Referring physician: Dr. Carrington Clamp T Francis Butler. is a 44 y.o. male  History of present illness:  Francis Butler is a 44 year old right-handed white male with a 5-year history of onset of vertigo associated with low-frequency hearing deficit.  The patient was seen by Dr. Benjamine Mola initially from ENT and was diagnosed with Mnire's disease.  The patient however claims at the same time of onset with the vertigo, he began to have some persistent dull achy pain in the V2 distribution of the right face, but he has some tenderness to light touch throughout the entire right side of the head, he also has developed some chronic discomfort of the right neck and shoulder.  He is getting massage therapy on a regular basis with some benefit.  The patient eventually was seen by Dr. Azucena Cecil from ENT, the patient underwent a right mastoidectomy, he has had multiple injections of dexamethasone into the endolymph of the ear without benefit.  He has been on hydrochlorothiazide and low-salt diet.  He continues to have episodes of vertigo and hearing change.  The patient indicates that with his exacerbations of his Mnire's symptoms, he also gets increased pain in the right face.  He has noted that loud noises or chemical odors will bring on attacks.  For this reason, the patient was referred to Dr. Joretta Bachelor from neurology for treatment of possible migraine headaches.  The patient has been treated with Vivactil, verapamil, Topamax, Ajovy, and Ubrelvy.  He has taken Maxalt for the headache with minimal benefit.  Trials on oral steroids have transiently offered some benefit but as soon as the medication is stopped, the pain resumes.  The patient denies any increased pain with chewing or swallowing or talking.  He has no weakness but feels "weak all over".  He does get some blurred vision with the increased discomfort at times.  He denies issues controlling the bowels or the bladder, he does  have chronic vertigo and some gait instability, he is no longer driving.  He has had MRI of the brain done on 30 August 2017, this was unremarkable.  The comes to this office for further evaluation.  With the exacerbations of the vertigo, he also has some difficulty with focusing and concentration.  Past Medical History:  Diagnosis Date  . Diabetes mellitus without complication ([place])   . GERD (gastroesophageal reflux disease)   . Headache   . HTN (hypertension)   . OSA on CPAP     Past Surgical History:  Procedure Laterality Date  . wisdom teeth removal       Family History  Problem Relation Age of Onset  . Hypertension Father     Social history:  reports that he has never smoked. He has never used smokeless tobacco. He reports that he does not drink alcohol or use drugs.  Medications:  Prior to Admission medications   Medication Sig Start Date End Date Taking? Authorizing Provider  cyclobenzaprine (FLEXERIL) 10 MG tablet TAKE 1 TABLET BY MOUTH THREE TIMES DAILY AS NEEDED 09/14/15  Yes [provider]  diazepam (VALIUM) 10 MG tablet TAKE 1 TABLET BY MOUTH EVERY 6 HOURS AS NEEDED 09/13/19  Yes [provider]  Fremanezumab-vfrm (AJOVY) 225 MG/1.5ML SOSY INJECT 1.5 MLS INTO THE SKIN EVERY 30 DAYS 06/20/19  Yes [provider]  ibuprofen (ADVIL,MOTRIN) 200 MG tablet Take 400 mg by mouth every 6 (six) hours as needed for mild pain or moderate pain.  Yes [provider]  lisinopril (ZESTRIL) 10 MG tablet Take by mouth. 09/19/16  Yes [provider]  MELATONIN PO Take by mouth.   Yes [provider]  metoCLOPramide (REGLAN) 10 MG tablet Take by mouth. 07/23/19 07/23/20 Yes [provider]  omeprazole (PRILOSEC) 20 MG capsule Take 20 mg by mouth daily.   Yes [provider]  ondansetron (ZOFRAN) 4 MG tablet Take 1 tablet (4 mg total) by mouth every 6 (six) hours as needed for nausea. 07/15/14  Yes Alison Murray, MD   ondansetron (ZOFRAN-ODT) 8 MG disintegrating tablet TAKE 1 TABLET BY MOUTH EVERY 8 HOURS AS NEEDED FOR NAUSEA FOR UP TO 7 DAYS 10/02/18  Yes [provider]  protriptyline (VIVACTIL) 10 MG tablet Take 15 mg by mouth every morning. 09/23/19  Yes [provider]  rizatriptan (MAXALT) 10 MG tablet Take by mouth. 10/10/18  Yes [provider]  triamcinolone cream (KENALOG) 0.1 % Apply 1 application topically 2 (two) times daily as needed (for psoriasis).   Yes [provider]      Allergies  Allergen Reactions  . Bee Pollen Swelling  . Scopolamine Other (See Comments)    Pupils to dilate  . Penicillins Hives    ROS:  Out of a complete 14 system review of symptoms, the patient complains only of the following symptoms, and all other reviewed systems are negative.  Headache, neck pain, vertigo Weight loss  Blood pressure (!) 142/93, pulse (!) 112, temperature (!) 97.2 F (36.2 C), height 5\' 5"  (1.651 m), weight 249 lb (112.9 kg).  Physical Exam  General: The patient is alert and cooperative at the time of the examination.  The patient is markedly obese.  Eyes: Pupils are equal, round, and reactive to light. Discs are flat bilaterally.  Neck: The neck is supple, no carotid bruits are noted.  Respiratory: The respiratory examination is clear.  Cardiovascular: The cardiovascular examination reveals a regular rate and rhythm, no obvious murmurs or rubs are noted.  Skin: Extremities are without significant edema.  Neurologic Exam  Mental status: The patient is alert and oriented x 3 at the time of the examination. The patient has apparent normal recent and remote memory, with an apparently normal attention span and concentration ability.  Cranial nerves: Facial symmetry is present. There is good sensation of the face to pinprick and soft touch bilaterally.  The patient is more sensitive to pinprick and soft touch on the right face as compared to the  left.  The strength of the facial muscles and the muscles to head turning and shoulder shrug are normal bilaterally. Speech is well enunciated, no aphasia or dysarthria is noted. Extraocular movements are full. Visual fields are full. The tongue is midline, and the patient has symmetric elevation of the soft palate. No obvious hearing deficits are noted.  Motor: The motor testing reveals 5 over 5 strength of all 4 extremities. Good symmetric motor tone is noted throughout.  Sensory: Sensory testing is intact to pinprick, soft touch, vibration sensation, and position sense on all 4 extremities. No evidence of extinction is noted.  Coordination: Cerebellar testing reveals good finger-nose-finger and heel-to-shin bilaterally.  Gait and station: Gait is normal. Tandem gait is unsteady. Romberg is negative. No drift is seen.  Reflexes: Deep tendon reflexes are symmetric and normal bilaterally. Toes are downgoing bilaterally.   MRI brain 08/30/17:  CONCLUSION:  1.  No acute intracranial abnormality.  2.  Similar post-surgical changes of prior right mastoidectomy.  Assessment/Plan:  1.  Atypical facial pain, right  2.  Vertigo, previous diagnosis of Mnire's disease  The patient has an unusual symptom complex.  He had onset of what sounds like Mnire's disease with vertigo associated with low-frequency hearing deficit.  However, at the same time he had onset of right facial discomfort that has been persistent and some discomfort into the right neck and shoulder.  The pain has been ongoing, and has not responded to typical migraine therapy.  The pain including the vertigo seems to be worsened by chemical odors and loud noises.  These are migrainous features.  The patient will be given a brief trial on indomethacin to exclude a sympathetic mediated headache syndrome, if no benefit after 3 weeks he is to contact our office and I will initiate treatment with gabapentin and try to get him started  on Botox therapy.  If these treatments do not help, then I will refer him to a neurologist who specializes in head and neck pain syndromes.  He will follow-up otherwise in about 4 months.   Francis Palau MD 10/02/2019 12:07 PM  [facility] [address] [place], [place] [address]  Phone [phone] Fax [phone]

## 2019-10-02 NOTE — Patient Instructions (Signed)
Stop the Ajovy, start Indomethacin 75 mg twice a day for at least 3 weeks, call if there is no benefit.

## 2019-10-17 ENCOUNTER — Other Ambulatory Visit: Payer: Self-pay | Admitting: Neurology

## 2019-10-17 DIAGNOSIS — G43711 Chronic migraine without aura, intractable, with status migrainosus: Secondary | ICD-10-CM

## 2019-10-17 MED ORDER — GABAPENTIN 300 MG PO CAPS: ORAL_CAPSULE | ORAL | 3 refills | Status: DC

## 2019-10-22 ENCOUNTER — Telehealth: Payer: Self-pay | Admitting: Neurology

## 2019-10-22 NOTE — Telephone Encounter (Signed)
I called [employer] [phone]) and spoke with Chanetta Marshall. She states code [phone] requires PA, but [phone] does not. She transferred me to the pharmacy and I spoke with Lynette C. She states she will fax the PA form over for Botox.

## 2019-10-22 NOTE — Telephone Encounter (Signed)
I received the form via fax. I filled it out and gave it to the work-in MD to sign because Dr. Anne Hahn is out of office. MD signed and I faxed back to [facility] [phone]). Awaiting response.

## 2019-11-05 NOTE — Telephone Encounter (Signed)
I hadn't received a response to my PA request, so I called [employer]. They stated they never received the PA and advised me to fax it again. I faxed it back to [phone].

## 2019-11-06 NOTE — Telephone Encounter (Signed)
Received PA approval from [facility] via fax. PA #[id number]. Valid 11/05/19 to 05/02/20.

## 2019-11-20 ENCOUNTER — Other Ambulatory Visit: Payer: Self-pay

## 2019-11-20 ENCOUNTER — Encounter (HOSPITAL_COMMUNITY): Payer: Self-pay | Admitting: Emergency Medicine

## 2019-11-20 ENCOUNTER — Inpatient Hospital Stay (HOSPITAL_COMMUNITY)
Admission: EM | Admit: 2019-11-20 | Discharge: 2019-11-24 | DRG: 372 | Disposition: A | Discharge status: Home / Self Care | Payer: BC Managed Care – PPO | Attending: Internal Medicine | Admitting: Internal Medicine

## 2019-11-20 DIAGNOSIS — K219 Gastro-esophageal reflux disease without esophagitis: Secondary | ICD-10-CM | POA: Diagnosis present

## 2019-11-20 DIAGNOSIS — N179 Acute kidney failure, unspecified: Secondary | ICD-10-CM | POA: Diagnosis present

## 2019-11-20 DIAGNOSIS — E669 Obesity, unspecified: Secondary | ICD-10-CM | POA: Diagnosis present

## 2019-11-20 DIAGNOSIS — G43809 Other migraine, not intractable, without status migrainosus: Secondary | ICD-10-CM | POA: Diagnosis present

## 2019-11-20 DIAGNOSIS — Z8249 Family history of ischemic heart disease and other diseases of the circulatory system: Secondary | ICD-10-CM

## 2019-11-20 DIAGNOSIS — E860 Dehydration: Secondary | ICD-10-CM | POA: Diagnosis not present

## 2019-11-20 DIAGNOSIS — A020 Salmonella enteritis: Secondary | ICD-10-CM | POA: Diagnosis not present

## 2019-11-20 DIAGNOSIS — Z880 Allergy status to penicillin: Secondary | ICD-10-CM

## 2019-11-20 DIAGNOSIS — A045 Campylobacter enteritis: Secondary | ICD-10-CM | POA: Diagnosis present

## 2019-11-20 DIAGNOSIS — R42 Dizziness and giddiness: Secondary | ICD-10-CM

## 2019-11-20 DIAGNOSIS — R509 Fever, unspecified: Secondary | ICD-10-CM

## 2019-11-20 DIAGNOSIS — Z20822 Contact with and (suspected) exposure to covid-19: Secondary | ICD-10-CM | POA: Diagnosis present

## 2019-11-20 DIAGNOSIS — K529 Noninfective gastroenteritis and colitis, unspecified: Secondary | ICD-10-CM

## 2019-11-20 DIAGNOSIS — I10 Essential (primary) hypertension: Secondary | ICD-10-CM | POA: Diagnosis present

## 2019-11-20 DIAGNOSIS — Z6841 Body Mass Index (BMI) 40.0 and over, adult: Secondary | ICD-10-CM

## 2019-11-20 DIAGNOSIS — G4733 Obstructive sleep apnea (adult) (pediatric): Secondary | ICD-10-CM | POA: Diagnosis present

## 2019-11-20 LAB — CBC
HCT: 45.9 % (ref 39.0–52.0)
Hemoglobin: 15.3 g/dL (ref 13.0–17.0)
MCH: 28.5 pg (ref 26.0–34.0)
MCHC: 33.3 g/dL (ref 30.0–36.0)
MCV: 85.5 fL (ref 80.0–100.0)
Platelets: 278 10*3/uL (ref 150–400)
RBC: 5.37 MIL/uL (ref 4.22–5.81)
RDW: 13.8 % (ref 11.5–15.5)
WBC: 15.7 10*3/uL — ABNORMAL HIGH (ref 4.0–10.5)
nRBC: 0 % (ref 0.0–0.2)

## 2019-11-20 LAB — URINALYSIS, ROUTINE W REFLEX MICROSCOPIC
Bilirubin Urine: NEGATIVE
Glucose, UA: NEGATIVE mg/dL
Hgb urine dipstick: NEGATIVE
Ketones, ur: NEGATIVE mg/dL
Leukocytes,Ua: NEGATIVE
Nitrite: NEGATIVE
Protein, ur: NEGATIVE mg/dL
Specific Gravity, Urine: 1.002 — ABNORMAL LOW (ref 1.005–1.030)
pH: 7 (ref 5.0–8.0)

## 2019-11-20 LAB — LIPASE, BLOOD: Lipase: 26 U/L (ref 11–51)

## 2019-11-20 LAB — COMPREHENSIVE METABOLIC PANEL
ALT: 24 U/L (ref 0–44)
AST: 19 U/L (ref 15–41)
Albumin: 4 g/dL (ref 3.5–5.0)
Alkaline Phosphatase: 61 U/L (ref 38–126)
Anion gap: 10 (ref 5–15)
BUN: 9 mg/dL (ref 6–20)
CO2: 23 mmol/L (ref 22–32)
Calcium: 8.8 mg/dL — ABNORMAL LOW (ref 8.9–10.3)
Chloride: 103 mmol/L (ref 98–111)
Creatinine, Ser: 1.49 mg/dL — ABNORMAL HIGH (ref 0.61–1.24)
GFR calc Af Amer: >60 mL/min (ref >60)
GFR calc non Af Amer: 56 mL/min — ABNORMAL LOW (ref >60)
Glucose, Bld: 109 mg/dL — ABNORMAL HIGH (ref 70–99)
Potassium: 3.7 mmol/L (ref 3.5–5.1)
Sodium: 136 mmol/L (ref 135–145)
Total Bilirubin: 0.8 mg/dL (ref 0.3–1.2)
Total Protein: 6.6 g/dL (ref 6.5–8.1)

## 2019-11-20 MED ORDER — SODIUM CHLORIDE 0.9% FLUSH: 3.0000 mL | Freq: Once | INTRAVENOUS | Status: DC

## 2019-11-20 NOTE — ED Triage Notes (Addendum)
Pt c/o fever, nausea/vomiting, right ear pain and dizziness that started on Sunday.

## 2019-11-21 ENCOUNTER — Emergency Department (HOSPITAL_COMMUNITY): Payer: BC Managed Care – PPO

## 2019-11-21 ENCOUNTER — Other Ambulatory Visit: Payer: Self-pay | Admitting: Neurology

## 2019-11-21 ENCOUNTER — Inpatient Hospital Stay (HOSPITAL_COMMUNITY): Payer: BC Managed Care – PPO

## 2019-11-21 DIAGNOSIS — A045 Campylobacter enteritis: Secondary | ICD-10-CM | POA: Diagnosis present

## 2019-11-21 DIAGNOSIS — R509 Fever, unspecified: Secondary | ICD-10-CM | POA: Diagnosis not present

## 2019-11-21 DIAGNOSIS — G43809 Other migraine, not intractable, without status migrainosus: Secondary | ICD-10-CM | POA: Diagnosis present

## 2019-11-21 DIAGNOSIS — R42 Dizziness and giddiness: Secondary | ICD-10-CM | POA: Diagnosis not present

## 2019-11-21 DIAGNOSIS — Z20822 Contact with and (suspected) exposure to covid-19: Secondary | ICD-10-CM | POA: Diagnosis present

## 2019-11-21 DIAGNOSIS — N179 Acute kidney failure, unspecified: Secondary | ICD-10-CM | POA: Diagnosis present

## 2019-11-21 DIAGNOSIS — E860 Dehydration: Secondary | ICD-10-CM | POA: Diagnosis present

## 2019-11-21 DIAGNOSIS — K219 Gastro-esophageal reflux disease without esophagitis: Secondary | ICD-10-CM | POA: Diagnosis present

## 2019-11-21 DIAGNOSIS — I10 Essential (primary) hypertension: Secondary | ICD-10-CM | POA: Diagnosis present

## 2019-11-21 DIAGNOSIS — K529 Noninfective gastroenteritis and colitis, unspecified: Secondary | ICD-10-CM | POA: Diagnosis not present

## 2019-11-21 DIAGNOSIS — Z6841 Body Mass Index (BMI) 40.0 and over, adult: Secondary | ICD-10-CM | POA: Diagnosis not present

## 2019-11-21 DIAGNOSIS — Z880 Allergy status to penicillin: Secondary | ICD-10-CM | POA: Diagnosis not present

## 2019-11-21 DIAGNOSIS — E669 Obesity, unspecified: Secondary | ICD-10-CM | POA: Diagnosis present

## 2019-11-21 DIAGNOSIS — Z8249 Family history of ischemic heart disease and other diseases of the circulatory system: Secondary | ICD-10-CM | POA: Diagnosis not present

## 2019-11-21 DIAGNOSIS — A020 Salmonella enteritis: Secondary | ICD-10-CM | POA: Diagnosis present

## 2019-11-21 DIAGNOSIS — G4733 Obstructive sleep apnea (adult) (pediatric): Secondary | ICD-10-CM | POA: Diagnosis present

## 2019-11-21 LAB — SARS CORONAVIRUS 2 BY RT PCR (HOSPITAL ORDER, PERFORMED IN ~~LOC~~ HOSPITAL LAB): SARS Coronavirus 2: NEGATIVE

## 2019-11-21 LAB — LACTIC ACID, PLASMA: Lactic Acid, Venous: 1.2 mmol/L (ref 0.5–1.9)

## 2019-11-21 LAB — SEDIMENTATION RATE: Sed Rate: 30 mm/hr — ABNORMAL HIGH (ref 0–16)

## 2019-11-21 LAB — C-REACTIVE PROTEIN: CRP: 12.6 mg/dL — ABNORMAL HIGH (ref <1.0)

## 2019-11-21 LAB — PROCALCITONIN: Procalcitonin: 0.29 ng/mL

## 2019-11-21 MED ORDER — SUMATRIPTAN SUCCINATE 50 MG PO TABS
50.0000 mg | ORAL_TABLET | ORAL | Status: DC | PRN
  Filled 2019-11-21: qty 1

## 2019-11-21 MED ORDER — LORAZEPAM 0.5 MG PO TABS: 0.5000 mg | ORAL_TABLET | Freq: Four times a day (QID) | ORAL | Status: DC | PRN

## 2019-11-21 MED ORDER — IOHEXOL 350 MG/ML SOLN
100.0000 mL | Freq: Once | INTRAVENOUS | Status: AC | PRN
  Administered 2019-11-21: 100 mL via INTRAVENOUS

## 2019-11-21 MED ORDER — LACTATED RINGERS IV BOLUS
1000.0000 mL | Freq: Once | INTRAVENOUS | Status: AC
  Administered 2019-11-21: 1000 mL via INTRAVENOUS

## 2019-11-21 MED ORDER — HYDRALAZINE HCL 25 MG PO TABS: 25.0000 mg | ORAL_TABLET | Freq: Four times a day (QID) | ORAL | Status: DC | PRN

## 2019-11-21 MED ORDER — CYCLOBENZAPRINE HCL 10 MG PO TABS
10.0000 mg | ORAL_TABLET | Freq: Three times a day (TID) | ORAL | Status: DC | PRN
  Administered 2019-11-21 – 2019-11-23 (×3): 10 mg via ORAL
  Filled 2019-11-21 (×3): qty 1

## 2019-11-21 MED ORDER — ONDANSETRON 4 MG PO TBDP
8.0000 mg | ORAL_TABLET | Freq: Three times a day (TID) | ORAL | Status: DC | PRN
  Administered 2019-11-21 – 2019-11-22 (×3): 8 mg via ORAL
  Filled 2019-11-21 (×4): qty 2

## 2019-11-21 MED ORDER — B2 100 MG PO TABS: 100.0000 mg | ORAL_TABLET | Freq: Every day | ORAL | Status: DC

## 2019-11-21 MED ORDER — SODIUM CHLORIDE 0.9 % IV SOLN
2.0000 g | Freq: Every day | INTRAVENOUS | Status: DC
  Administered 2019-11-21 – 2019-11-22 (×2): 2 g via INTRAVENOUS
  Filled 2019-11-21 (×2): qty 20

## 2019-11-21 MED ORDER — METRONIDAZOLE IN NACL 5-0.79 MG/ML-% IV SOLN
500.0000 mg | Freq: Three times a day (TID) | INTRAVENOUS | Status: DC
  Administered 2019-11-22 (×2): 500 mg via INTRAVENOUS
  Filled 2019-11-21 (×2): qty 100

## 2019-11-21 MED ORDER — MELATONIN 5 MG PO TABS
5.0000 mg | ORAL_TABLET | Freq: Every day | ORAL | Status: DC
  Administered 2019-11-21 – 2019-11-23 (×3): 5 mg via ORAL
  Filled 2019-11-21 (×4): qty 1

## 2019-11-21 MED ORDER — ONDANSETRON HCL 4 MG/2ML IJ SOLN
4.0000 mg | Freq: Once | INTRAMUSCULAR | Status: AC
  Administered 2019-11-21: 4 mg via INTRAVENOUS
  Filled 2019-11-21: qty 2

## 2019-11-21 MED ORDER — ACETAMINOPHEN 325 MG PO TABS
650.0000 mg | ORAL_TABLET | Freq: Four times a day (QID) | ORAL | Status: DC | PRN
  Administered 2019-11-21 – 2019-11-23 (×4): 650 mg via ORAL
  Filled 2019-11-21 (×4): qty 2

## 2019-11-21 MED ORDER — DIAZEPAM 5 MG PO TABS
5.0000 mg | ORAL_TABLET | Freq: Four times a day (QID) | ORAL | 1 refills | Status: DC | PRN
Start: 2019-11-21 — End: 2020-01-08

## 2019-11-21 MED ORDER — GABAPENTIN 300 MG PO CAPS
300.0000 mg | ORAL_CAPSULE | Freq: Three times a day (TID) | ORAL | Status: DC
  Administered 2019-11-21 – 2019-11-24 (×9): 300 mg via ORAL
  Filled 2019-11-21 (×9): qty 1

## 2019-11-21 MED ORDER — ONDANSETRON 8 MG PO TBDP: 8.0000 mg | ORAL_TABLET | Freq: Three times a day (TID) | ORAL | 3 refills | Status: DC | PRN

## 2019-11-21 MED ORDER — MAGNESIUM OXIDE 400 (241.3 MG) MG PO TABS
400.0000 mg | ORAL_TABLET | Freq: Every day | ORAL | Status: DC
  Administered 2019-11-21 – 2019-11-24 (×4): 400 mg via ORAL
  Filled 2019-11-21 (×4): qty 1

## 2019-11-21 MED ORDER — PANTOPRAZOLE SODIUM 40 MG PO TBEC
40.0000 mg | DELAYED_RELEASE_TABLET | Freq: Every day | ORAL | Status: DC
  Administered 2019-11-21 – 2019-11-24 (×4): 40 mg via ORAL
  Filled 2019-11-21 (×4): qty 1

## 2019-11-21 MED ORDER — IBUPROFEN 400 MG PO TABS
400.0000 mg | ORAL_TABLET | Freq: Four times a day (QID) | ORAL | Status: DC | PRN
Start: 2019-11-21 — End: 2019-11-21

## 2019-11-21 MED ORDER — VERAPAMIL HCL ER 180 MG PO TBCR
180.0000 mg | EXTENDED_RELEASE_TABLET | Freq: Every evening | ORAL | Status: DC
  Administered 2019-11-21 – 2019-11-23 (×3): 180 mg via ORAL
  Filled 2019-11-21 (×5): qty 1

## 2019-11-21 MED ORDER — DIAZEPAM 5 MG PO TABS
5.0000 mg | ORAL_TABLET | Freq: Four times a day (QID) | ORAL | Status: DC | PRN
  Administered 2019-11-23 – 2019-11-24 (×2): 5 mg via ORAL
  Filled 2019-11-21 (×2): qty 1

## 2019-11-21 MED ORDER — ENOXAPARIN SODIUM 40 MG/0.4ML ~~LOC~~ SOLN
40.0000 mg | SUBCUTANEOUS | Status: DC
  Administered 2019-11-21 – 2019-11-22 (×2): 40 mg via SUBCUTANEOUS
  Filled 2019-11-21 (×3): qty 0.4

## 2019-11-21 MED ORDER — RIZATRIPTAN BENZOATE 10 MG PO TABS: 10.0000 mg | ORAL_TABLET | Freq: Three times a day (TID) | ORAL | 5 refills | Status: DC | PRN

## 2019-11-21 NOTE — ED Notes (Signed)
Report given to inpatient nurse.

## 2019-11-21 NOTE — ED Provider Notes (Signed)
[facility] EMERGENCY DEPARTMENT Provider Note   CSN: [id number] Arrival date & time: 11/20/19  2149     History Chief Complaint  Patient presents with  . Emesis    Francis Butler. is a 44 y.o. male.  Patient presents to the emergency department for evaluation of fever.  He reports that he has not been feeling well for approximately 5 days.  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  He is complaining of pain on the right side of his head around the ear but this is no different than headaches he has had before.  He has noticed some nausea and vomiting with abdominal discomfort this week that resolved but tonight he started to notice that he was hot and sweaty and took his temperature which was 102.  He then checked his pulse which was 130 so he came to the ER for evaluation.        Past Medical History:  Diagnosis Date  . Diabetes mellitus without complication (HCC)   . GERD (gastroesophageal reflux disease)   . Headache   . HTN (hypertension)   . OSA on CPAP     Patient Active Problem List   Diagnosis Date Noted  . 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 Dizziness and giddiness   . Near syncope 07/14/2014  . Essential hypertension 07/14/2014  . Hyperglycemia 07/14/2014  . Leukocytosis 07/14/2014  . Hypokalemia 07/14/2014  . Diaphoresis     Past Surgical History:  Procedure Laterality Date  . wisdom teeth removal          Family History  Problem Relation Age of Onset  . Hypertension Father     Social History   Tobacco Use  . Smoking status: Never Smoker  . Smokeless tobacco: Never Used  Substance Use Topics  . Alcohol use: No    Alcohol/week: 0.0 standard drinks  . Drug use: No    Home Medications Prior to Admission medications   Medication Sig Start Date End Date Taking? Authorizing Provider  cyclobenzaprine (FLEXERIL) 10 MG  tablet Take 10 mg by mouth in the morning and at bedtime.  09/14/15  Yes [provider]  diazepam (VALIUM) 10 MG tablet Take 5 mg by mouth every 6 (six) hours as needed (For dizziness).  09/13/19  Yes [provider]  gabapentin (NEURONTIN) 300 MG capsule 1 capsule twice daily for 2 weeks then take 1 in the morning and 2 in the evening Patient taking differently: Take 300 mg by mouth 3 (three) times daily.  10/17/19  Yes York Spaniel, MD  ibuprofen (ADVIL,MOTRIN) 200 MG tablet Take 400 mg by mouth every 6 (six) hours as needed for mild pain or moderate pain.   Yes [provider]  lisinopril (ZESTRIL) 10 MG tablet Take 10 mg by mouth daily.  09/19/16  Yes [provider]  MAGNESIUM PO Take 500 mg by mouth.   Yes [provider]  MELATONIN PO Take 5 mg by mouth at bedtime.    Yes [provider]  omeprazole (PRILOSEC) 20 MG capsule Take 20 mg by mouth daily.   Yes [provider]  ondansetron (ZOFRAN-ODT) 8 MG disintegrating tablet Take 8 mg by mouth every 8 (eight) hours as needed for nausea or vomiting.  10/02/18  Yes [provider]  Riboflavin (B2 PO) Take 100 mg by mouth in the morning and at bedtime.  Yes [provider]  rizatriptan (MAXALT) 10 MG tablet Take 10 mg by mouth daily as needed (For dizziness).  10/10/18  Yes [provider]  verapamil (CALAN-SR) 180 MG CR tablet Take 180 mg by mouth every evening.   Yes [provider]  metoCLOPramide (REGLAN) 10 MG tablet Take by mouth every 6 (six) hours as needed.  Patient not taking: Reported on 11/20/2019 07/23/19 07/23/20  [provider]  ondansetron (ZOFRAN) 4 MG tablet Take 1 tablet (4 mg total) by mouth every 6 (six) hours as needed for nausea. Patient not taking: Reported on 11/20/2019 07/15/14   Alison Murrayevine, Alma M, MD    Allergies    Bee pollen, Scopolamine, and Penicillins  Review of Systems   Review of Systems  Constitutional:  Positive for diaphoresis and fever.  Gastrointestinal: Positive for abdominal pain, nausea and vomiting.  Neurological: Positive for headaches.  All other systems reviewed and are negative.   Physical Exam Updated Vital Signs BP 124/78   Pulse (!) 118   Temp 99.7 F (37.6 C)   Resp (!) 21   SpO2 96%   Physical Exam Vitals and nursing note reviewed.  Constitutional:      General: He is not in acute distress.    Appearance: Normal appearance. He is well-developed.  HENT:     Head: Normocephalic and atraumatic.     Right Ear: Hearing normal.     Left Ear: Hearing normal.     Nose: Nose normal.  Eyes:     Conjunctiva/sclera: Conjunctivae normal.     Pupils: Pupils are equal, round, and reactive to light.  Cardiovascular:     Rate and Rhythm: Regular rhythm.     Heart sounds: S1 normal and S2 normal. No murmur heard.  No friction rub. No gallop.   Pulmonary:     Effort: Pulmonary effort is normal. No respiratory distress.     Breath sounds: Normal breath sounds.  Chest:     Chest wall: No tenderness.  Abdominal:     General: Bowel sounds are normal.     Palpations: Abdomen is soft.     Tenderness: There is abdominal tenderness in the periumbilical area and suprapubic area. There is no guarding or rebound. Negative signs include Murphy's sign and McBurney's sign.     Hernia: No hernia is present.  Musculoskeletal:        General: Normal range of motion.     Cervical back: Normal range of motion and neck supple.  Skin:    General: Skin is warm and dry.     Findings: No rash.  Neurological:     Mental Status: He is alert and oriented to person, place, and time.     GCS: GCS eye subscore is 4. GCS verbal subscore is 5. GCS motor subscore is 6.     Cranial Nerves: No cranial nerve deficit.     Sensory: No sensory deficit.     Coordination: Coordination normal.  Psychiatric:        Speech: Speech normal.        Behavior: Behavior normal.        Thought Content: Thought  content normal.     ED Results / Procedures / Treatments   Labs (all labs ordered are listed, but only abnormal results are displayed) Labs Reviewed  COMPREHENSIVE METABOLIC PANEL - Abnormal; Notable for the following components:      Result Value   Glucose, Bld 109 (*)    Creatinine, Ser 1.49 (*)  Calcium 8.8 (*)    GFR calc non Af Amer 56 (*)    All other components within normal limits  CBC - Abnormal; Notable for the following components:   WBC 15.7 (*)    All other components within normal limits  URINALYSIS, ROUTINE W REFLEX MICROSCOPIC - Abnormal; Notable for the following components:   Color, Urine COLORLESS (*)    Specific Gravity, Urine 1.002 (*)    All other components within normal limits  SARS CORONAVIRUS 2 BY RT PCR (HOSPITAL ORDER, PERFORMED IN [facility])  URINE CULTURE  CULTURE, BLOOD (ROUTINE X 2)  CULTURE, BLOOD (ROUTINE X 2)  LIPASE, BLOOD  LACTIC ACID, PLASMA    EKG EKG Interpretation  Date/Time:  Wednesday November 20 2019 22:02:51 EDT Ventricular Rate:  126 PR Interval:  146 QRS Duration: 94 QT Interval:  300 QTC Calculation: 434 R Axis:   64 Text Interpretation: Sinus tachycardia Cannot rule out Anterior infarct , age undetermined Abnormal ECG Confirmed by Gilda Crease [phone]) on 11/21/2019 5:03:41 AM   Radiology DG Chest Port 1 View  Result Date: 11/21/2019 CLINICAL DATA:  Fever with nausea and vomiting EXAM: PORTABLE CHEST 1 VIEW COMPARISON:  03/20/2014 FINDINGS: Normal heart size and mediastinal contours. No acute infiltrate or edema. No effusion or pneumothorax. No acute osseous findings. Remote left rib fractures. Artifact from EKG leads. IMPRESSION: Negative for pneumonia. Electronically Signed   By: Marnee Spring M.D.   On: 11/21/2019 05:59    Procedures Procedures (including critical care time)  Medications Ordered in ED Medications  sodium chloride flush (NS) 0.9 % injection 3 mL (3 mLs Intravenous Not Given  11/21/19 0740)  lactated ringers bolus 1,000 mL (1,000 mLs Intravenous New Bag/Given 11/21/19 0739)    Followed by  lactated ringers bolus 1,000 mL (has no administration in time range)  ondansetron (ZOFRAN) injection 4 mg (4 mg Intravenous Given 11/21/19 0748)    ED Course  I have reviewed the triage vital signs and the nursing notes.  Pertinent labs & imaging results that were available during my care of the patient were reviewed by me and considered in my medical decision making (see chart for details).    MDM Rules/Calculators/A&P                          Patient presents to the emergency department for evaluation of fever.  He has been feeling poorly for most of this week.  He started with GI symptoms including nausea and vomiting with abdominal discomfort.  That started to feel better but then he developed generalized myalgias and aches, feeling flulike.  He did not have any specific other complaints such as sore throat, cough, urinary symptoms.  Tonight he noticed that he was experiencing fever and tachycardia so he presented to the ER.  He does appear well.  Lab work is reassuring.  He has a leukocytosis but otherwise lab work is normal.  This includes a normal lactic acid.  There was a delay in the establishment of IV access.  He will be given a fluid bolus and then rechecked.  He will also have a CT of his abdomen and pelvis to rule out intra-abdominal pathology causing his fever because he does have some mild tenderness.  If all of this is negative, expect that this is viral in nature.  Will sign out to oncoming ER physician to reevaluate the patient after CT scan and fluid bolus.  Final  Clinical Impression(s) / ED Diagnoses Final diagnoses:  Febrile illness, acute    Rx / DC Orders ED Discharge Orders    None       Ashton Sabine, Canary Brim, MD 11/21/19 [phone]

## 2019-11-21 NOTE — ED Provider Notes (Signed)
Patient CARE signed out to follow-up CT scan and reassess.  Patient presented after vomiting, diarrhea, abdominal discomfort, fever and vertigo symptoms for the past few days.  Patient blood work showed leukocytosis and patient had a fever on arrival with tachycardia.  CT scan results reviewed showing significant inflammation/enteritis.  Patient denies recent antibiotics, no history of bowel disease known.  Patient still symptomatic but feels improved.  Patient's heart rate 115 in the room.  Discussed with hospitalist for admission for further evaluation and management.  Hospitalist will decide which and whether to start antibiotics.  Updated patient on plan.  Kenton Kingfisher, MD 11/21/19 [phone]

## 2019-11-21 NOTE — ED Notes (Signed)
Francis Butler, would like an update [phone]

## 2019-11-21 NOTE — Progress Notes (Signed)
   11/21/19 2032  Assess: MEWS Score  Temp 99.7 F (37.6 C)  BP (!) 143/92  Pulse Rate (!) 111  Resp 18  SpO2 97 %  O2 Device Room Air  Assess: MEWS Score  MEWS Temp 0  MEWS Systolic 0  MEWS Pulse 2  MEWS RR 0  MEWS LOC 0  MEWS Score 2  MEWS Score Color Yellow  Assess: if the MEWS score is Yellow or Red  Were vital signs taken at a resting state? Yes  Focused Assessment Documented focused assessment  Early Detection of Sepsis Score *See Row Information* Medium  MEWS guidelines implemented *See Row Information* Yes  Treat  MEWS Interventions Escalated (See documentation below)  Take Vital Signs  Increase Vital Sign Frequency  Yellow: Q 2hr X 2 then Q 4hr X 2, if remains yellow, continue Q 4hrs  Escalate  MEWS: Escalate Yellow: discuss with charge nurse/RN and consider discussing with provider and RRT  Notify: Charge Nurse/RN  Name of Charge Nurse/RN Notified Nikki M.,RN  Date Charge Nurse/RN Notified 11/21/19  Time Charge Nurse/RN Notified 2045  Notify: Provider  Provider Name/Title  (pt. yellow mews in ED. CN Leta Baptist. notified.)  Notify: Rapid Response  Name of Rapid Response RN Notified  (Pt. yellow mews in ED. CN Leta Baptist. notified)  Document  Patient Outcome Other (Comment)  Progress note created (see row info) Yes

## 2019-11-21 NOTE — H&P (Signed)
History and Physical    Francis Butler. MCN:[id number] DOB: 1976/03/20 DOA: 11/20/2019  PCP: Sharilyn Sites, MD (Confirm with patient/family/NH records and if not entered, this has to be entered at [facility] point of entry) Patient coming from: Home  I have personally briefly reviewed patient's old medical records in [place]  Chief Complaint: Dizzy, vomiting, fever  HPI: Francis Holmer. is a 44 y.o. male with medical history significant of Mnire's disease, vestibular vertigo, question of trigeminal neuralgia, presented with worsening of headache vertigo and new onset fever.  His symptoms started about 5 to 7 days ago, gradually getting worse.  He had a similar episode 3 months ago which resolved by its own.  But the patient reported that the fever is something new.  He has been following with neurologist and ENT as outpatient for chronic vertigo and headache problems.  Multiple regimen were tried without success.  And most recently, neurology Dr. Jannifer Franklin proposed Botox injection which patient scheduled for next month.  This time, patient reported headache has been radiating down to the right side of the neck, and he always has headaches on the right side, and his hearing also deteriorated as he described as stuffed a cotton ball in the ear.  Denies any ear discharge, no trouble to open jaws or bite.  Denies any chills but he decided to check temperature yesterday nonetheless and found temperature was 102.  He denies any cough, no dysuria.  Patient also claims he had loose diarrhea about 5 days ago on and off 1-3 episodes a day, no abdominal pain, and diarrhea disappeared 3 days ago. ED Course: WBC 15.7, creatinine 1.49, potassium 3.7.  MRI negative for acute finding. CTA suspicious for ascending colitis.  Review of Systems: As per HPI otherwise 10 point review of systems negative.    Past Medical History:  Diagnosis Date  . Diabetes mellitus without complication ([place])   . GERD  (gastroesophageal reflux disease)   . Headache   . HTN (hypertension)   . OSA on CPAP     Past Surgical History:  Procedure Laterality Date  . wisdom teeth removal        reports that he has never smoked. He has never used smokeless tobacco. He reports that he does not drink alcohol and does not use drugs.  Allergies  Allergen Reactions  . Bee Pollen Swelling  . Scopolamine Other (See Comments)    Pupils to dilate  . Penicillins Hives    Family History  Problem Relation Age of Onset  . Hypertension Father      Prior to Admission medications   Medication Sig Start Date End Date Taking? Authorizing Provider  cyclobenzaprine (FLEXERIL) 10 MG tablet Take 10 mg by mouth in the morning and at bedtime.  09/14/15  Yes [provider]  gabapentin (NEURONTIN) 300 MG capsule 1 capsule twice daily for 2 weeks then take 1 in the morning and 2 in the evening Patient taking differently: Take 300 mg by mouth 3 (three) times daily.  10/17/19  Yes Kathrynn Ducking, MD  ibuprofen (ADVIL,MOTRIN) 200 MG tablet Take 400 mg by mouth every 6 (six) hours as needed for mild pain or moderate pain.   Yes [provider]  lisinopril (ZESTRIL) 10 MG tablet Take 10 mg by mouth daily.  09/19/16  Yes [provider]  MAGNESIUM PO Take 500 mg by mouth.   Yes [provider]  MELATONIN PO Take 5 mg by mouth at  bedtime.    Yes [provider]  omeprazole (PRILOSEC) 20 MG capsule Take 20 mg by mouth daily.   Yes [provider]  Riboflavin (B2 PO) Take 100 mg by mouth in the morning and at bedtime.   Yes [provider]  verapamil (CALAN-SR) 180 MG CR tablet Take 180 mg by mouth every evening.   Yes [provider]  diazepam (VALIUM) 5 MG tablet Take 1 tablet (5 mg total) by mouth every 6 (six) hours as needed for anxiety. 11/21/19   Kathrynn Ducking, MD  metoCLOPramide (REGLAN) 10 MG tablet Take by mouth every 6 (six) hours as needed.  Patient  not taking: Reported on 11/20/2019 07/23/19 07/23/20  [provider]  ondansetron (ZOFRAN) 4 MG tablet Take 1 tablet (4 mg total) by mouth every 6 (six) hours as needed for nausea. Patient not taking: Reported on 11/20/2019 07/15/14   Robbie Lis, MD  ondansetron (ZOFRAN-ODT) 8 MG disintegrating tablet Take 1 tablet (8 mg total) by mouth every 8 (eight) hours as needed for nausea or vomiting. 11/21/19   Kathrynn Ducking, MD  rizatriptan (MAXALT) 10 MG tablet Take 1 tablet (10 mg total) by mouth 3 (three) times daily as needed (For dizziness). 11/21/19   Kathrynn Ducking, MD    Physical Exam: Vitals:   11/21/19 1250 11/21/19 1300 11/21/19 1350 11/21/19 1533  BP: (!) 142/91 (!) 147/66  (!) 149/88  Pulse: (!) 117 (!) 119 (!) 118 (!) 123  Resp:  16 (!) 26 18  Temp:    (!) 101.1 F (38.4 C)  TempSrc:    Oral  SpO2: 99% 99% 100% 96%    Constitutional: NAD, calm, comfortable Vitals:   11/21/19 1250 11/21/19 1300 11/21/19 1350 11/21/19 1533  BP: (!) 142/91 (!) 147/66  (!) 149/88  Pulse: (!) 117 (!) 119 (!) 118 (!) 123  Resp:  16 (!) 26 18  Temp:    (!) 101.1 F (38.4 C)  TempSrc:    Oral  SpO2: 99% 99% 100% 96%   Eyes: PERRL, lids and conjunctivae normal ENMT: Mucous membranes are moist. Posterior pharynx clear of any exudate or lesions.Normal dentition. Tenderness on the right mastoid Neck: normal, supple, no masses, no thyromegaly Respiratory: clear to auscultation bilaterally, no wheezing, no crackles. Normal respiratory effort. No accessory muscle use.  Cardiovascular: Regular rate and rhythm, no murmurs / rubs / gallops. No extremity edema. 2+ pedal pulses. No carotid bruits.  Abdomen: no tenderness, no masses palpated. No hepatosplenomegaly. Bowel sounds positive.  Musculoskeletal: no clubbing / cyanosis. No joint deformity upper and lower extremities. Good ROM, no contractures. Normal muscle tone.  Skin: no rashes, lesions, ulcers. No induration Neurologic: CN 2-12 grossly  intact. Sensation intact, DTR normal. Strength 5/5 in all 4.  Psychiatric: Normal judgment and insight. Alert and oriented x 3. Normal mood.     Labs on Admission: I have personally reviewed following labs and imaging studies  CBC: Recent Labs  Lab 11/20/19 2207  WBC 15.7*  HGB 15.3  HCT 45.9  MCV 85.5  PLT 741   Basic Metabolic Panel: Recent Labs  Lab 11/20/19 2207  NA 136  K 3.7  CL 103  CO2 23  GLUCOSE 109*  BUN 9  CREATININE 1.49*  CALCIUM 8.8*   GFR: CrCl cannot be calculated (Unknown ideal weight.). Liver Function Tests: Recent Labs  Lab 11/20/19 2207  AST 19  ALT 24  ALKPHOS 61  BILITOT 0.8  PROT 6.6  ALBUMIN  4.0   Recent Labs  Lab 11/20/19 2207  LIPASE 26   No results for input(s): AMMONIA in the last 168 hours. Coagulation Profile: No results for input(s): INR, PROTIME in the last 168 hours. Cardiac Enzymes: No results for input(s): CKTOTAL, CKMB, CKMBINDEX, TROPONINI in the last 168 hours. BNP (last 3 results) No results for input(s): PROBNP in the last 8760 hours. HbA1C: No results for input(s): HGBA1C in the last 72 hours. CBG: No results for input(s): GLUCAP in the last 168 hours. Lipid Profile: No results for input(s): CHOL, HDL, LDLCALC, TRIG, CHOLHDL, LDLDIRECT in the last 72 hours. Thyroid Function Tests: No results for input(s): TSH, T4TOTAL, FREET4, T3FREE, THYROIDAB in the last 72 hours. Anemia Panel: No results for input(s): VITAMINB12, FOLATE, FERRITIN, TIBC, IRON, RETICCTPCT in the last 72 hours. Urine analysis:    Component Value Date/Time   COLORURINE COLORLESS (A) 11/20/2019 2200   APPEARANCEUR CLEAR 11/20/2019 2200   LABSPEC 1.002 (L) 11/20/2019 2200   PHURINE 7.0 11/20/2019 2200   GLUCOSEU NEGATIVE 11/20/2019 2200   HGBUR NEGATIVE 11/20/2019 2200   BILIRUBINUR NEGATIVE 11/20/2019 2200   KETONESUR NEGATIVE 11/20/2019 2200   PROTEINUR NEGATIVE 11/20/2019 2200   UROBILINOGEN 1.0 07/14/2014 2008   NITRITE NEGATIVE  11/20/2019 2200   LEUKOCYTESUR NEGATIVE 11/20/2019 2200    Radiological Exams on Admission: MR BRAIN WO CONTRAST  Result Date: 11/21/2019 CLINICAL DATA:  Headache, acute, normal neuro exam. EXAM: MRI HEAD WITHOUT CONTRAST TECHNIQUE: Multiplanar, multiecho pulse sequences of the brain and surrounding structures were obtained without intravenous contrast. COMPARISON:  Brain MRI 05/29/2014, head CT 07/14/2014. FINDINGS: Brain: Mild intermittent motion degradation. Cerebral volume is normal. There is no significant white matter disease for age. There is no acute infarct. No evidence of intracranial mass. No chronic intracranial blood products. No extra-axial fluid collection. No midline shift. Vascular: Expected proximal arterial flow voids. Skull and upper cervical spine: No focal suspicious marrow lesion. Sinuses/Orbits: Visualized orbits show no acute finding. Mild ethmoid sinus mucosal thickening. Small right mastoid effusion. IMPRESSION: Unremarkable MRI appearance of the brain for age. No evidence of acute intracranial abnormality. Mild ethmoid sinus mucosal thickening. Small right mastoid effusion. Electronically Signed   By: Kellie Simmering DO   On: 11/21/2019 16:54   CT ABDOMEN PELVIS W CONTRAST  Result Date: 11/21/2019 CLINICAL DATA:  Abdominal pain and fever EXAM: CT ABDOMEN AND PELVIS WITH CONTRAST TECHNIQUE: Multidetector CT imaging of the abdomen and pelvis was performed using the standard protocol following bolus administration of intravenous contrast. CONTRAST:  126m OMNIPAQUE IOHEXOL 350 MG/ML SOLN COMPARISON:  None FINDINGS: Lower chest: Tiny pulmonary nodule at the LEFT lung base (image 3, series 4) small nodule, perhaps along the minor fissure in the RIGHT chest 3 mm (image 1,, series 3) no consolidation. No pleural effusion. Hepatobiliary: No focal, suspicious hepatic lesion. Hepatic steatosis. Portal vein is patent. No pericholecystic stranding. Pancreas: Pancreas is normal. Spleen: Spleen  at upper limits of normal for size. Adrenals/Urinary Tract: Adrenal glands are normal Symmetric renal enhancement. Smooth renal contours. Distension of the urinary bladder reaching the low abdomen. No hydronephrosis. Stomach/Bowel: Small hiatal hernia. Stomach is under distended limiting assessment. Mural stratification of the terminal ileum with mild distal and terminal ileal stranding. No small bowel obstruction. The appendix is normal. Signs of colitis affecting the ascending and proximal transverse colon preferentially. Stool fills the remainder of the colon. Vascular/Lymphatic: Portal vein and SMV are patent.  SMA is patent. No adenopathy. Retroaortic LEFT renal vein. No atherosclerotic plaque  or aneurysmal dilation of the abdominal aorta. No pelvic lymphadenopathy. Reproductive: Prostate unremarkable by CT. Other: Small fat containing umbilical hernia. Musculoskeletal: Signs of old trauma to the LEFT hemithorax with healed rib fractures. IMPRESSION: 1. Signs of enterocolitis affecting the distal ileum, ascending and proximal transverse colon preferentially. Correlate with any risk factors for infectious colitis such is recent antibiotic administration. Distribution is nonspecific and could also be seen in the setting of inflammatory bowel disease. 2. Hepatic steatosis with mildly enlarged spleen. Correlate with any clinical or laboratory evidence of liver disease. 3. Marked distension of the urinary bladder extending into the low abdomen. Finding could be seen in the setting of bladder outlet obstruction. No hydronephrosis. 4. Small hiatal hernia. 5. Small fat containing umbilical hernia. 6. Signs of old trauma to the LEFT hemithorax with healed rib fractures. 7. Aortic atherosclerosis. Aortic Atherosclerosis (ICD10-I70.0). Electronically Signed   By: Zetta Bills M.D.   On: 11/21/2019 09:09   DG Chest Port 1 View  Result Date: 11/21/2019 CLINICAL DATA:  Fever with nausea and vomiting EXAM: PORTABLE CHEST  1 VIEW COMPARISON:  03/20/2014 FINDINGS: Normal heart size and mediastinal contours. No acute infiltrate or edema. No effusion or pneumothorax. No acute osseous findings. Remote left rib fractures. Artifact from EKG leads. IMPRESSION: Negative for pneumonia. Electronically Signed   By: Monte Fantasia M.D.   On: 11/21/2019 05:59    EKG: Independently reviewed. NSR  Assessment/Plan Active Problems:   Fever  Fever - No clear source, procalcitonin level borderline elevated 0.29, CT abdomen showed signs of ascending cholangitis however reported resolving diarrhea symptoms 3 days ago, MRI head did not show significant acute etiology to justify active infection. I sent calcitonin level tomorrow, along with ESR and CRP.  I placed a call to ID who has not called me back.  Hold antibiotics for now given no clear source to treat, if fever comes back consider consult ID.  Worsening of vertigo and migraines -No clear etiology, neurology has tried several regimens without success.  I discussed this with on-call neurologist Dr. Cheral Marker, who, and I contact patient neurology directly.  Placed a call x3 to neurology Dr. Jannifer Franklin, with all busy lines and long waiting.  Patient reports meclizine and gabapentin never worked.  AKI -Probably from dehydration, will give IV fluids and recheck kidney function. -Hold ACEI  HTN -As above.   DVT prophylaxis: Lovenox Code Status: Full Code Family Communication: Mother at bedside Disposition Plan: Likely will need 2-3 days hospital stay to work on fever and headache. Consults called: Neurology Admission status: Tele admit   Lequita Halt MD [facility] Pager [phone]    11/21/2019, 6:25 PM

## 2019-11-21 NOTE — ED Notes (Signed)
Pt ambulatory to and from restroom with steady gait 

## 2019-11-21 NOTE — Progress Notes (Signed)
Floor coverage  Patient admitted for fever.  Labs showing leukocytosis.  CT with signs of enterocolitis.  Patient told admitting MD that he had diarrhea about 5 days ago on and off 1-3 episodes a day, no abdominal pain, and diarrhea disappeared 3 days ago.  I was paged by nursing staff informing me that patient told them that he has had persistent diarrhea and had 2 episodes in the ED. -Start ceftriaxone and Flagyl -C. difficile PCR and GI pathogen panel ordered

## 2019-11-22 DIAGNOSIS — K529 Noninfective gastroenteritis and colitis, unspecified: Secondary | ICD-10-CM

## 2019-11-22 DIAGNOSIS — R509 Fever, unspecified: Secondary | ICD-10-CM

## 2019-11-22 LAB — URINE CULTURE: Culture: NO GROWTH

## 2019-11-22 LAB — CBC
HCT: 44.6 % (ref 39.0–52.0)
Hemoglobin: 14.9 g/dL (ref 13.0–17.0)
MCH: 29 pg (ref 26.0–34.0)
MCHC: 33.4 g/dL (ref 30.0–36.0)
MCV: 86.8 fL (ref 80.0–100.0)
Platelets: 215 10*3/uL (ref 150–400)
RBC: 5.14 MIL/uL (ref 4.22–5.81)
RDW: 14 % (ref 11.5–15.5)
WBC: 11 10*3/uL — ABNORMAL HIGH (ref 4.0–10.5)
nRBC: 0 % (ref 0.0–0.2)

## 2019-11-22 LAB — BASIC METABOLIC PANEL
Anion gap: 9 (ref 5–15)
BUN: 10 mg/dL (ref 6–20)
CO2: 22 mmol/L (ref 22–32)
Calcium: 8.3 mg/dL — ABNORMAL LOW (ref 8.9–10.3)
Chloride: 107 mmol/L (ref 98–111)
Creatinine, Ser: 1.09 mg/dL (ref 0.61–1.24)
GFR calc Af Amer: >60 mL/min (ref >60)
GFR calc non Af Amer: >60 mL/min (ref >60)
Glucose, Bld: 113 mg/dL — ABNORMAL HIGH (ref 70–99)
Potassium: 3.6 mmol/L (ref 3.5–5.1)
Sodium: 138 mmol/L (ref 135–145)

## 2019-11-22 LAB — C DIFFICILE QUICK SCREEN W PCR REFLEX
C Diff antigen: NEGATIVE
C Diff interpretation: NOT DETECTED
C Diff toxin: NEGATIVE

## 2019-11-22 LAB — PROCALCITONIN: Procalcitonin: 0.22 ng/mL

## 2019-11-22 MED ORDER — METRONIDAZOLE 500 MG PO TABS
500.0000 mg | ORAL_TABLET | Freq: Three times a day (TID) | ORAL | Status: DC
  Administered 2019-11-22 (×2): 500 mg via ORAL
  Filled 2019-11-22 (×2): qty 1

## 2019-11-22 NOTE — Progress Notes (Signed)
New Admission Note:   Arrival Method: from ED via stretcher Mental Orientation: Alert & oriented x4 Telemetry: 5M3, CCMD notified Assessment: to be completed Skin: Intact IV: LFA, SL Pain: 0/10 Tubes: None Safety Measures: Safety Fall Prevention Plan has been discussed  Admission: to be completed 5 [place] Orientation: Patient has been orientated to the room, unit and staff.   Family: none at bedside  Orders to be reviewed and implemented. Will continue to monitor the patient. Call light has been placed within reach and bed alarm has been activated.

## 2019-11-22 NOTE — Plan of Care (Signed)
  Problem: Education: Goal: Knowledge of General Education information will improve Description: Including pain rating scale, medication(s)/side effects and non-pharmacologic comfort measures Outcome: Progressing   Problem: Clinical Measurements: Goal: Ability to maintain clinical measurements within normal limits will improve Outcome: Progressing   

## 2019-11-22 NOTE — Progress Notes (Signed)
Progress Note    Francis Butler.  GYB:[id number] DOB: May 18, 1976  DOA: 11/20/2019 PCP: Assunta Found, MD    Brief Narrative:    Medical records reviewed and are as summarized below:  Francis Butler. is an 44 y.o. male with medical history significant of Mnire's disease, vestibular vertigo, question of trigeminal neuralgia, presented with worsening of headache vertigo and new onset fever.  His symptoms started about 5 to 7 days ago, gradually getting worse.  He had a similar episode 3 months ago which resolved by its own.  But the patient reported that the fever is something new.  He has been following with neurologist and ENT as outpatient for chronic vertigo and headache problems.  MRI negative for acute finding. CTA suspicious for ascending colitis.  When asked, patient has been having diarrhea and thought he has "food poisoning"  Assessment/Plan:   Active Problems:   Fever   Enterocolitis   Fever suspected source is enterocolitis affecting the distal ileum, ascending and proximal transverse colon preferentially. -diarrhea symptoms 3 days ago -IV abx started  -GI pathogen panel sent and c diff as well  Worsening of vertigo and migraines -No clear etiology, neurology has tried several regimens without success.   -Dr. Anne Hahn managing: getting botox next month -suspect stress is contributing  AKI -Probably from dehydration -Hold ACEI  HTN -As above.  obesity Body mass index is 41.64 kg/m.   Family Communication/Anticipated D/C date and plan/Code Status   DVT prophylaxis: Lovenox ordered. Code Status: Full Code.  Family Communication: mother at bedside Disposition Plan: Status is: Inpatient  Remains inpatient appropriate because:IV treatments appropriate due to intensity of illness or inability to take PO   Dispo: The patient is from: Home              Anticipated d/c is to: Home              Anticipated d/c date is: 1 day              Patient  currently is not medically stable to d/c.         Medical Consultants:    None.    Subjective:   Still having diarrhea Most concerned about his chronic pain on his face  Objective:    Vitals:   11/21/19 2232 11/22/19 0033 11/22/19 0437 11/22/19 0857  BP: (!) 135/95 (!) 149/88 135/88 134/76  Pulse: (!) 112 (!) 112 (!) 103 99  Resp: 20 18 16 18   Temp: 99.8 F (37.7 C) (!) 100.4 F (38 C) 98.6 F (37 C) 98.7 F (37.1 C)  TempSrc: Oral Oral Oral Oral  SpO2: 94% 96% 96% 95%  Weight:        Intake/Output Summary (Last 24 hours) at 11/22/2019 1452 Last data filed at 11/22/2019 1300 Gross per 24 hour  Intake 1020 ml  Output 1000 ml  Net 20 ml   Filed Weights   11/21/19 2032  Weight: 113.5 kg    Exam:  General: Appearance:    Severely obese male in no acute distress     Lungs:     Clear to auscultation bilaterally, respirations unlabored  Heart:    Normal heart rate. Normal rhythm. No murmurs, rubs, or gallops.   MS:   All extremities are intact.   Neurologic:   Awake, alert, oriented x 3. No apparent focal neurological           defect.     Data Reviewed:  I have personally reviewed following labs and imaging studies:  Labs: Labs show the following:   Basic Metabolic Panel: Recent Labs  Lab 11/20/19 2207 11/22/19 0531  NA 136 138  K 3.7 3.6  CL 103 107  CO2 23 22  GLUCOSE 109* 113*  BUN 9 10  CREATININE 1.49* 1.09  CALCIUM 8.8* 8.3*   GFR Estimated Creatinine Clearance: 100.7 mL/min (by C-G formula based on SCr of 1.09 mg/dL). Liver Function Tests: Recent Labs  Lab 11/20/19 2207  AST 19  ALT 24  ALKPHOS 61  BILITOT 0.8  PROT 6.6  ALBUMIN 4.0   Recent Labs  Lab 11/20/19 2207  LIPASE 26   No results for input(s): AMMONIA in the last 168 hours. Coagulation profile No results for input(s): INR, PROTIME in the last 168 hours.  CBC: Recent Labs  Lab 11/20/19 2207 11/22/19 0531  WBC 15.7* 11.0*  HGB 15.3 14.9  HCT 45.9 44.6    MCV 85.5 86.8  PLT 278 215   Cardiac Enzymes: No results for input(s): CKTOTAL, CKMB, CKMBINDEX, TROPONINI in the last 168 hours. BNP (last 3 results) No results for input(s): PROBNP in the last 8760 hours. CBG: No results for input(s): GLUCAP in the last 168 hours. D-Dimer: No results for input(s): DDIMER in the last 72 hours. Hgb A1c: No results for input(s): HGBA1C in the last 72 hours. Lipid Profile: No results for input(s): CHOL, HDL, LDLCALC, TRIG, CHOLHDL, LDLDIRECT in the last 72 hours. Thyroid function studies: No results for input(s): TSH, T4TOTAL, T3FREE, THYROIDAB in the last 72 hours.  Invalid input(s): FREET3 Anemia work up: No results for input(s): VITAMINB12, FOLATE, FERRITIN, TIBC, IRON, RETICCTPCT in the last 72 hours. Sepsis Labs: Recent Labs  Lab 11/20/19 2207 11/21/19 0601 11/21/19 1220 11/22/19 0531  PROCALCITON  --   --  0.29 0.22  WBC 15.7*  --   --  11.0*  LATICACIDVEN  --  1.2  --   --     Microbiology Recent Results (from the past 240 hour(s))  Urine Culture     Status: None   Collection Time: 11/20/19 10:00 PM   Specimen: Urine, Random  Result Value Ref Range Status   Specimen Description URINE, RANDOM  Final   Special Requests NONE  Final   Culture   Final    NO GROWTH Performed at [facility], [address], [place], [place] [address]    Report Status 11/22/2019 FINAL  Final  Culture, blood (Routine X 2) w Reflex to ID Panel     Status: None (Preliminary result)   Collection Time: 11/21/19  6:03 AM   Specimen: BLOOD  Result Value Ref Range Status   Specimen Description BLOOD LEFT ANTECUBITAL  Final   Special Requests   Final    BOTTLES DRAWN AEROBIC AND ANAEROBIC Blood Culture results may not be optimal due to an inadequate volume of blood received in culture bottles   Culture   Final    NO GROWTH 1 DAY Performed at [facility], [address] [place], [place] [address]    Report Status PENDING  Incomplete  SARS  Coronavirus 2 by RT PCR (hospital order, performed in [facility] hospital lab) Nasopharyngeal Nasopharyngeal Swab     Status: None   Collection Time: 11/21/19  6:13 AM   Specimen: Nasopharyngeal Swab  Result Value Ref Range Status   SARS Coronavirus 2 NEGATIVE NEGATIVE Final    Comment: (NOTE) SARS-CoV-2 target nucleic acids are NOT  DETECTED.  The SARS-CoV-2 RNA is generally detectable in upper and lower respiratory specimens during the acute phase of infection. The lowest concentration of SARS-CoV-2 viral copies this assay can detect is 250 copies / mL. A negative result does not preclude SARS-CoV-2 infection and should not be used as the sole basis for treatment or other patient management decisions.  A negative result may occur with improper specimen collection / handling, submission of specimen other than nasopharyngeal swab, presence of viral mutation(s) within the areas targeted by this assay, and inadequate number of viral copies (<250 copies / mL). A negative result must be combined with clinical observations, patient history, and epidemiological information.  Fact Sheet for Patients:   BoilerBrush.com.cy  Fact Sheet for Healthcare Providers: https://pope.com/  This test is not yet approved or  cleared by the [place] FDA and has been authorized for detection and/or diagnosis of SARS-CoV-2 by FDA under an Emergency Use Authorization (EUA).  This EUA will remain in effect (meaning this test can be used) for the duration of the COVID-19 declaration under Section 564(b)(1) of the Act, 21 U.S.C. section 360bbb-3(b)(1), unless the authorization is terminated or revoked sooner.  Performed at [facility], [address] [place], [place] [address]   Culture, blood (Routine X 2) w Reflex to ID Panel     Status: None (Preliminary result)   Collection Time: 11/21/19  9:05 AM   Specimen: BLOOD  Result Value Ref Range  Status   Specimen Description BLOOD RIGHT ANTECUBITAL  Final   Special Requests   Final    BOTTLES DRAWN AEROBIC AND ANAEROBIC Blood Culture results may not be optimal due to an inadequate volume of blood received in culture bottles   Culture   Final    NO GROWTH < 24 HOURS Performed at [facility], [address] [place], [place] [address]    Report Status PENDING  Incomplete    Procedures and diagnostic studies:  MR BRAIN WO CONTRAST  Result Date: 11/21/2019 CLINICAL DATA:  Headache, acute, normal neuro exam. EXAM: MRI HEAD WITHOUT CONTRAST TECHNIQUE: Multiplanar, multiecho pulse sequences of the brain and surrounding structures were obtained without intravenous contrast. COMPARISON:  Brain MRI 05/29/2014, head CT 07/14/2014. FINDINGS: Brain: Mild intermittent motion degradation. Cerebral volume is normal. There is no significant white matter disease for age. There is no acute infarct. No evidence of intracranial mass. No chronic intracranial blood products. No extra-axial fluid collection. No midline shift. Vascular: Expected proximal arterial flow voids. Skull and upper cervical spine: No focal suspicious marrow lesion. Sinuses/Orbits: Visualized orbits show no acute finding. Mild ethmoid sinus mucosal thickening. Small right mastoid effusion. IMPRESSION: Unremarkable MRI appearance of the brain for age. No evidence of acute intracranial abnormality. Mild ethmoid sinus mucosal thickening. Small right mastoid effusion. Electronically Signed   By: Jackey Loge DO   On: 11/21/2019 16:54   CT ABDOMEN PELVIS W CONTRAST  Result Date: 11/21/2019 CLINICAL DATA:  Abdominal pain and fever EXAM: CT ABDOMEN AND PELVIS WITH CONTRAST TECHNIQUE: Multidetector CT imaging of the abdomen and pelvis was performed using the standard protocol following bolus administration of intravenous contrast. CONTRAST:  OMNIPAQUE IOHEXOL 350 MG/ML SOLN COMPARISON:  None FINDINGS: Lower chest: Tiny pulmonary nodule  at the LEFT lung base (image 3, series 4) small nodule, perhaps along the minor fissure in the RIGHT chest 3 mm (image 1,, series 3) no consolidation. No pleural effusion. Hepatobiliary: No focal, suspicious hepatic lesion. Hepatic steatosis. Portal vein is  patent. No pericholecystic stranding. Pancreas: Pancreas is normal. Spleen: Spleen at upper limits of normal for size. Adrenals/Urinary Tract: Adrenal glands are normal Symmetric renal enhancement. Smooth renal contours. Distension of the urinary bladder reaching the low abdomen. No hydronephrosis. Stomach/Bowel: Small hiatal hernia. Stomach is under distended limiting assessment. Mural stratification of the terminal ileum with mild distal and terminal ileal stranding. No small bowel obstruction. The appendix is normal. Signs of colitis affecting the ascending and proximal transverse colon preferentially. Stool fills the remainder of the colon. Vascular/Lymphatic: Portal vein and SMV are patent.  SMA is patent. No adenopathy. Retroaortic LEFT renal vein. No atherosclerotic plaque or aneurysmal dilation of the abdominal aorta. No pelvic lymphadenopathy. Reproductive: Prostate unremarkable by CT. Other: Small fat containing umbilical hernia. Musculoskeletal: Signs of old trauma to the LEFT hemithorax with healed rib fractures. IMPRESSION: 1. Signs of enterocolitis affecting the distal ileum, ascending and proximal transverse colon preferentially. Correlate with any risk factors for infectious colitis such is recent antibiotic administration. Distribution is nonspecific and could also be seen in the setting of inflammatory bowel disease. 2. Hepatic steatosis with mildly enlarged spleen. Correlate with any clinical or laboratory evidence of liver disease. 3. Marked distension of the urinary bladder extending into the low abdomen. Finding could be seen in the setting of bladder outlet obstruction. No hydronephrosis. 4. Small hiatal hernia. 5. Small fat containing  umbilical hernia. 6. Signs of old trauma to the LEFT hemithorax with healed rib fractures. 7. Aortic atherosclerosis. Aortic Atherosclerosis (ICD10-I70.0). Electronically Signed   By: Donzetta KohutGeoffrey  Wile M.D.   On: 11/21/2019 09:09   DG Chest Port 1 View  Result Date: 11/21/2019 CLINICAL DATA:  Fever with nausea and vomiting EXAM: PORTABLE CHEST 1 VIEW COMPARISON:  03/20/2014 FINDINGS: Normal heart size and mediastinal contours. No acute infiltrate or edema. No effusion or pneumothorax. No acute osseous findings. Remote left rib fractures. Artifact from EKG leads. IMPRESSION: Negative for pneumonia. Electronically Signed   By: Marnee SpringJonathon  Watts M.D.   On: 11/21/2019 05:59    Medications:   . enoxaparin (LOVENOX) injection  40 mg Subcutaneous Q24H  . gabapentin  300 mg Oral TID  . magnesium oxide  400 mg Oral Daily  . melatonin  5 mg Oral QHS  . metroNIDAZOLE  500 mg Oral Q8H  . pantoprazole  40 mg Oral Daily  . sodium chloride flush  3 mL Intravenous Once  . verapamil  180 mg Oral QPM   Continuous Infusions: . cefTRIAXone (ROCEPHIN)  IV Stopped (11/22/19 0027)     LOS: 1 day   Joseph ArtJessica U Ajane Novella  [facility]   How to contact the [facility] Attending or Consulting provider 7A - 7P or covering provider during after hours 7P -7A, for this patient?  1. Check the care team in [facility] and look for a) attending/consulting [facility] provider listed and b) the [facility] team listed 2. Log into [email or web address] and use [facility] universal password to access. If you do not have the password, please contact the hospital operator. 3. Locate the [facility] provider you are looking for under [facility] and page to a number that you can be directly reached. 4. If you still have difficulty reaching the provider, please page the [facility] (Director on Call) for the Hospitalists listed on amion for assistance.  11/22/2019, 2:52 PM

## 2019-11-22 NOTE — Evaluation (Signed)
Physical Therapy Evaluation Patient Details Name: Francis Butler. MRN: [id number] DOB: [birth year] Today's Date: 11/22/2019   History of Present Illness  Pt is a 44 y/o male admitted secondary to fever and diarrhea. Workup pending. 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   Clinical Impression  Pt admitted secondary to problem above with deficits below. Pt requiring min guard A for mobility this session. Mild unsteadiness noted and used IV pole for support. 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 Requesting for possible strategies to help with symptom management. Will have vestibular therapist see during next session. Will continue to follow acutely to maximize functional mobility independence and safety.     Follow Up Recommendations Outpatient PT (vestibular outpatient )    Equipment Recommendations  None recommended by PT    Recommendations for Other Services       Precautions / Restrictions Precautions Precautions: None Restrictions Weight Bearing Restrictions: No      Mobility  Bed Mobility Overal bed mobility: Modified Independent                Transfers Overall transfer level: Needs assistance Equipment used: None Transfers: Sit to/from Stand Sit to Stand: Min guard         General transfer comment: Min guard for safety.   Ambulation/Gait Ambulation/Gait assistance: Min guard Gait Distance (Feet): 75 Feet Assistive device: IV Pole Gait Pattern/deviations: Step-through pattern;Decreased dorsiflexion - right;Drifts right/left Gait velocity: Decreased   General Gait Details: Pt with mild unsteadiness and requiring min guard A for steadying. Pt using IV pole for support. Has walking stick at home that he normally uses when not feeling well.   Stairs            Wheelchair Mobility    Modified Rankin  (Stroke Patients Only)       Balance Overall balance assessment: Mild deficits observed, not formally tested                                           Pertinent Vitals/Pain Pain Assessment: 0-10 Pain Score: 3  Pain Location: R ear neck and shoulder Pain Descriptors / Indicators: Aching Pain Intervention(s): Limited activity within patient's tolerance;Monitored during session;Repositioned    Home Living Family/patient expects to be discharged to:: Private residence Living Arrangements: Alone Available Help at Discharge: Family Type of Home: Apartment Home Access: Level entry     Home Layout: One level Home Equipment: Other (comment);Cane - single point (walking stick )      Prior Function Level of Independence: Independent with assistive device(s)         Comments: USes walking stick depending on the day.      Hand Dominance        Extremity/Trunk Assessment   Upper Extremity Assessment Upper Extremity Assessment: Overall WFL for tasks assessed    Lower Extremity Assessment Lower Extremity Assessment: Overall WFL for tasks assessed       Communication   Communication: No difficulties  Cognition Arousal/Alertness: Awake/alert Behavior During Therapy: WFL for tasks assessed/performed Overall Cognitive Status: Within Functional Limits for tasks assessed  General Comments      Exercises     Assessment/Plan    PT Assessment Patient needs continued PT services  PT Problem List Decreased balance;Decreased activity tolerance;Decreased mobility       PT Treatment Interventions Gait training;Therapeutic activities;Functional mobility training;Therapeutic exercise;Balance training;Patient/family education    PT Goals (Current goals can be found in the Care Plan section)  Acute Rehab PT Goals Patient Stated Goal: to feel better PT Goal Formulation: With patient Time For Goal  Achievement: 12/06/19 Potential to Achieve Goals: Good    Frequency Min 3X/week   Barriers to discharge        Co-evaluation               AM-PAC PT "6 Clicks" Mobility  Outcome Measure Help needed turning from your back to your side while in a flat bed without using bedrails?: None Help needed moving from lying on your back to sitting on the side of a flat bed without using bedrails?: None Help needed moving to and from a bed to a chair (including a wheelchair)?: A Little Help needed standing up from a chair using your arms (e.g., wheelchair or bedside chair)?: A Little Help needed to walk in hospital room?: A Little Help needed climbing 3-5 steps with a railing? : A Lot 6 Click Score: 19    End of Session   Activity Tolerance: Patient tolerated treatment well Patient left: in bed;with call bell/phone within reach (sittin gEOB ) Nurse Communication: Mobility status PT Visit Diagnosis: Unsteadiness on feet (R26.81);Other abnormalities of gait and mobility (R26.89)    Time: 4742-5956 PT Time Calculation (min) (ACUTE ONLY): 15 min   Charges:   PT Evaluation $PT Eval Low Complexity: 1 Low          Cindee Salt, DPT  Acute Rehabilitation Services  Pager: [phone] Office: [phone]   Le[facility] Prom 11/22/2019, 10:45 AM

## 2019-11-23 LAB — GASTROINTESTINAL PANEL BY PCR, STOOL (REPLACES STOOL CULTURE)
Adenovirus F40/41: NOT DETECTED
Astrovirus: NOT DETECTED
Campylobacter species: DETECTED — AB
Cryptosporidium: NOT DETECTED
Cyclospora cayetanensis: NOT DETECTED
Entamoeba histolytica: NOT DETECTED
Enteroaggregative E coli (EAEC): NOT DETECTED
Enteropathogenic E coli (EPEC): NOT DETECTED
Enterotoxigenic E coli (ETEC): NOT DETECTED
Giardia lamblia: NOT DETECTED
Norovirus GI/GII: NOT DETECTED
Plesimonas shigelloides: NOT DETECTED
Rotavirus A: NOT DETECTED
Salmonella species: DETECTED — AB
Sapovirus (I, II, IV, and V): NOT DETECTED
Shiga like toxin producing E coli (STEC): NOT DETECTED
Shigella/Enteroinvasive E coli (EIEC): NOT DETECTED
Vibrio cholerae: NOT DETECTED
Vibrio species: NOT DETECTED
Yersinia enterocolitica: NOT DETECTED

## 2019-11-23 LAB — CBC
HCT: 43.8 % (ref 39.0–52.0)
Hemoglobin: 14.5 g/dL (ref 13.0–17.0)
MCH: 28.9 pg (ref 26.0–34.0)
MCHC: 33.1 g/dL (ref 30.0–36.0)
MCV: 87.3 fL (ref 80.0–100.0)
Platelets: 232 10*3/uL (ref 150–400)
RBC: 5.02 MIL/uL (ref 4.22–5.81)
RDW: 14.2 % (ref 11.5–15.5)
WBC: 8.5 10*3/uL (ref 4.0–10.5)
nRBC: 0 % (ref 0.0–0.2)

## 2019-11-23 LAB — BASIC METABOLIC PANEL
Anion gap: 11 (ref 5–15)
BUN: 9 mg/dL (ref 6–20)
CO2: 22 mmol/L (ref 22–32)
Calcium: 8.5 mg/dL — ABNORMAL LOW (ref 8.9–10.3)
Chloride: 107 mmol/L (ref 98–111)
Creatinine, Ser: 1.26 mg/dL — ABNORMAL HIGH (ref 0.61–1.24)
GFR calc Af Amer: >60 mL/min (ref >60)
GFR calc non Af Amer: >60 mL/min (ref >60)
Glucose, Bld: 108 mg/dL — ABNORMAL HIGH (ref 70–99)
Potassium: 4.6 mmol/L (ref 3.5–5.1)
Sodium: 140 mmol/L (ref 135–145)

## 2019-11-23 LAB — PROCALCITONIN: Procalcitonin: 0.19 ng/mL

## 2019-11-23 MED ORDER — SODIUM CHLORIDE 0.9 % IV SOLN
500.0000 mg | Freq: Every day | INTRAVENOUS | Status: DC
  Administered 2019-11-23: 500 mg via INTRAVENOUS
  Filled 2019-11-23 (×2): qty 500

## 2019-11-23 MED ORDER — SULFAMETHOXAZOLE-TRIMETHOPRIM 800-160 MG PO TABS
1.0000 | ORAL_TABLET | Freq: Two times a day (BID) | ORAL | Status: DC
  Administered 2019-11-23 – 2019-11-24 (×3): 1 via ORAL
  Filled 2019-11-23 (×3): qty 1

## 2019-11-23 MED ORDER — AZITHROMYCIN 500 MG PO TABS
500.0000 mg | ORAL_TABLET | Freq: Every day | ORAL | Status: DC
  Administered 2019-11-24: 500 mg via ORAL
  Filled 2019-11-23: qty 1

## 2019-11-23 NOTE — Progress Notes (Signed)
Progress Note    Francis Butler Koral Jr.  ZOX:[id number] DOB: 01/16/1976  DOA: 11/20/2019 PCP: Assunta FoundGolding, [name], MD    Brief Narrative:    Medical records reviewed and are as summarized below:  Francis Butler Zbikowski Jr. is an 44 y.o. male with medical history significant of Mnire's disease, vestibular vertigo, question of trigeminal neuralgia, presented with worsening of headache vertigo and new onset fever.  His symptoms started about 5 to 7 days ago, gradually getting worse.  He had a similar episode 3 months ago which resolved by its own.  But the patient reported that the fever is something new.  He has been following with neurologist and ENT as outpatient for chronic vertigo and headache problems.  MRI negative for acute finding. CTA suspicious for ascending colitis.  When asked, patient has been having diarrhea and thought he has "food poisoning"  Assessment/Plan:   Active Problems:   Fever   Enterocolitis   Fever suspected source is enterocolitis affecting the distal ileum, ascending and proximal transverse colon preferentially due to campylobacter and salmonella -no other family members sick -diarrhea symptoms improving -IV abx started - transition to PO (azithro for total of 3 days, bactrim for total with IV of 5 days) -c diff negative  Worsening of vertigo and migraines -No clear etiology, neurology has tried several regimens without success.   -Dr. Anne HahnWillis managing: getting botox next week -suspect stress is contributing  AKI -Probably from dehydration -Hold ACEI  HTN -As above.  obesity Body mass index is 41.64 kg/m.   Family Communication/Anticipated D/C date and plan/Code Status   DVT prophylaxis: Lovenox ordered. Code Status: Full Code.  Family Communication: mother at bedside Disposition Plan: Status is: Inpatient  Remains inpatient appropriate because:IV treatments appropriate due to intensity of illness or inability to take PO   Dispo: The patient is  from: Home              Anticipated d/c is to: Home              Anticipated d/c date is: today or in the AM pending PO intake          Medical Consultants:    None.    Subjective:   Diarrhea slowly improving  Objective:    Vitals:   11/22/19 1541 11/22/19 2142 11/23/19 0602 11/23/19 1013  BP: 132/83 122/82 (!) 146/93 (!) 145/99  Pulse: (!) 102 96 89 93  Resp: 18 18 18 20   Temp: 98.9 F (37.2 C) 99.8 F (37.7 C) 97.8 F (36.6 C) 98.6 F (37 C)  TempSrc: Oral Oral Oral Oral  SpO2: 96% 95% 99% 95%  Weight:  113.5 kg      Intake/Output Summary (Last 24 hours) at 11/23/2019 1507 Last data filed at 11/23/2019 0602 Gross per 24 hour  Intake 990 ml  Output 400 ml  Net 590 ml   Filed Weights   11/21/19 2032 11/22/19 2142  Weight: 113.5 kg 113.5 kg    Exam:   General: Appearance:    Severely obese male in no acute distress     Lungs:     Clear to auscultation bilaterally, respirations unlabored  Heart:    Normal heart rate. Normal rhythm. No murmurs, rubs, or gallops.   MS:   All extremities are intact.   Neurologic:   Awake, alert, oriented x 3. No apparent focal neurological           defect.     Data Reviewed:  I have personally reviewed following labs and imaging studies:  Labs: Labs show the following:   Basic Metabolic Panel: Recent Labs  Lab 11/20/19 2207 11/20/19 2207 11/22/19 0531 11/23/19 0357  NA 136  --  138 140  K 3.7   < > 3.6 4.6  CL 103  --  107 107  CO2 23  --  22 22  GLUCOSE 109*  --  113* 108*  BUN 9  --  10 9  CREATININE 1.49*  --  1.09 1.26*  CALCIUM 8.8*  --  8.3* 8.5*   < > = values in this interval not displayed.   GFR Estimated Creatinine Clearance: 87.1 mL/min (A) (by C-G formula based on SCr of 1.26 mg/dL (H)). Liver Function Tests: Recent Labs  Lab 11/20/19 2207  AST 19  ALT 24  ALKPHOS 61  BILITOT 0.8  PROT 6.6  ALBUMIN 4.0   Recent Labs  Lab 11/20/19 2207  LIPASE 26   No results for input(s):  AMMONIA in the last 168 hours. Coagulation profile No results for input(s): INR, PROTIME in the last 168 hours.  CBC: Recent Labs  Lab 11/20/19 2207 11/22/19 0531 11/23/19 0357  WBC 15.7* 11.0* 8.5  HGB 15.3 14.9 14.5  HCT 45.9 44.6 43.8  MCV 85.5 86.8 87.3  PLT 278 215 232   Cardiac Enzymes: No results for input(s): CKTOTAL, CKMB, CKMBINDEX, TROPONINI in the last 168 hours. BNP (last 3 results) No results for input(s): PROBNP in the last 8760 hours. CBG: No results for input(s): GLUCAP in the last 168 hours. D-Dimer: No results for input(s): DDIMER in the last 72 hours. Hgb A1c: No results for input(s): HGBA1C in the last 72 hours. Lipid Profile: No results for input(s): CHOL, HDL, LDLCALC, TRIG, CHOLHDL, LDLDIRECT in the last 72 hours. Thyroid function studies: No results for input(s): TSH, T4TOTAL, T3FREE, THYROIDAB in the last 72 hours.  Invalid input(s): FREET3 Anemia work up: No results for input(s): VITAMINB12, FOLATE, FERRITIN, TIBC, IRON, RETICCTPCT in the last 72 hours. Sepsis Labs: Recent Labs  Lab 11/20/19 2207 11/21/19 0601 11/21/19 1220 11/22/19 0531 11/23/19 0357  PROCALCITON  --   --  0.29 0.22 0.19  WBC 15.7*  --   --  11.0* 8.5  LATICACIDVEN  --  1.2  --   --   --     Microbiology Recent Results (from the past 240 hour(s))  Urine Culture     Status: None   Collection Time: 11/20/19 10:00 PM   Specimen: Urine, Random  Result Value Ref Range Status   Specimen Description URINE, RANDOM  Final   Special Requests NONE  Final   Culture   Final    NO GROWTH Performed at [facility], [address] [place], [place] [address]    Report Status 11/22/2019 FINAL  Final  Culture, blood (Routine X 2) w Reflex to ID Panel     Status: None (Preliminary result)   Collection Time: 11/21/19  6:03 AM   Specimen: BLOOD  Result Value Ref Range Status   Specimen Description BLOOD LEFT ANTECUBITAL  Final   Special Requests   Final    BOTTLES DRAWN  AEROBIC AND ANAEROBIC Blood Culture results may not be optimal due to an inadequate volume of blood received in culture bottles   Culture   Final    NO GROWTH 1 DAY Performed at [facility], [address] [place], [place] [address]    Report Status PENDING  Incomplete  SARS Coronavirus 2 by RT PCR (hospital order, performed in [facility] hospital lab) Nasopharyngeal Nasopharyngeal Swab     Status: None   Collection Time: 11/21/19  6:13 AM   Specimen: Nasopharyngeal Swab  Result Value Ref Range Status   SARS Coronavirus 2 NEGATIVE NEGATIVE Final    Comment: (NOTE) SARS-CoV-2 target nucleic acids are NOT DETECTED.  The SARS-CoV-2 RNA is generally detectable in upper and lower respiratory specimens during the acute phase of infection. The lowest concentration of SARS-CoV-2 viral copies this assay can detect is 250 copies / mL. A negative result does not preclude SARS-CoV-2 infection and should not be used as the sole basis for treatment or other patient management decisions.  A negative result may occur with improper specimen collection / handling, submission of specimen other than nasopharyngeal swab, presence of viral mutation(s) within the areas targeted by this assay, and inadequate number of viral copies (<250 copies / mL). A negative result must be combined with clinical observations, patient history, and epidemiological information.  Fact Sheet for Patients:   BoilerBrush.com.cy  Fact Sheet for Healthcare Providers: https://pope.com/  This test is not yet approved or  cleared by the [place] FDA and has been authorized for detection and/or diagnosis of SARS-CoV-2 by FDA under an Emergency Use Authorization (EUA).  This EUA will remain in effect (meaning this test can be used) for the duration of the COVID-19 declaration under Section 564(b)(1) of the Act, 21 U.S.C. section 360bbb-3(b)(1), unless the authorization  is terminated or revoked sooner.  Performed at [facility], [address], [place], [place] [address]   Culture, blood (Routine X 2) w Reflex to ID Panel     Status: None (Preliminary result)   Collection Time: 11/21/19  9:05 AM   Specimen: BLOOD  Result Value Ref Range Status   Specimen Description BLOOD RIGHT ANTECUBITAL  Final   Special Requests   Final    BOTTLES DRAWN AEROBIC AND ANAEROBIC Blood Culture results may not be optimal due to an inadequate volume of blood received in culture bottles   Culture   Final    NO GROWTH < 24 HOURS Performed at [facility], [address] [place], [place] [address]    Report Status PENDING  Incomplete  Gastrointestinal Panel by PCR , Stool     Status: Abnormal   Collection Time: 11/21/19 11:20 AM   Specimen: STOOL  Result Value Ref Range Status   Campylobacter species DETECTED (A) NOT DETECTED Final    Comment: CRITICAL RESULT CALLED TO, READ BACK BY AND VERIFIED WITH: KIM GENGLER @209  12/03/2019 TTG    Plesimonas shigelloides NOT DETECTED NOT DETECTED Final   Salmonella species DETECTED (A) NOT DETECTED Final    Comment: CRITICAL RESULT CALLED TO, READ BACK BY AND VERIFIED WITH: KIM GENGLER @209  11/23/2019 TTG    Yersinia enterocolitica NOT DETECTED NOT DETECTED Final   Vibrio species NOT DETECTED NOT DETECTED Final   Vibrio cholerae NOT DETECTED NOT DETECTED Final   Enteroaggregative E coli (EAEC) NOT DETECTED NOT DETECTED Final   Enteropathogenic E coli (EPEC) NOT DETECTED NOT DETECTED Final   Enterotoxigenic E coli (ETEC) NOT DETECTED NOT DETECTED Final   Shiga like toxin producing E coli (STEC) NOT DETECTED NOT DETECTED Final   Shigella/Enteroinvasive E coli (EIEC) NOT DETECTED NOT DETECTED Final   Cryptosporidium NOT DETECTED NOT DETECTED Final   Cyclospora cayetanensis NOT DETECTED NOT DETECTED Final   Entamoeba histolytica NOT DETECTED NOT DETECTED  Final   Giardia lamblia NOT DETECTED NOT DETECTED Final    Adenovirus F40/41 NOT DETECTED NOT DETECTED Final   Astrovirus NOT DETECTED NOT DETECTED Final   Norovirus GI/GII NOT DETECTED NOT DETECTED Final   Rotavirus A NOT DETECTED NOT DETECTED Final   Sapovirus (I, II, IV, and V) NOT DETECTED NOT DETECTED Final    Comment: Performed at [facility], [address] [place], [place] [address]  C Difficile Quick Screen w PCR reflex     Status: None   Collection Time: 11/21/19 11:24 PM   Specimen: STOOL  Result Value Ref Range Status   C Diff antigen NEGATIVE NEGATIVE Final   C Diff toxin NEGATIVE NEGATIVE Final   C Diff interpretation No C. difficile detected.  Final    Comment: Performed at [facility], [address] [place], [place] [address]    Procedures and diagnostic studies:  MR BRAIN WO CONTRAST  Result Date: 11/21/2019 CLINICAL DATA:  Headache, acute, normal neuro exam. EXAM: MRI HEAD WITHOUT CONTRAST TECHNIQUE: Multiplanar, multiecho pulse sequences of the brain and surrounding structures were obtained without intravenous contrast. COMPARISON:  Brain MRI 05/29/2014, head CT 07/14/2014. FINDINGS: Brain: Mild intermittent motion degradation. Cerebral volume is normal. There is no significant white matter disease for age. There is no acute infarct. No evidence of intracranial mass. No chronic intracranial blood products. No extra-axial fluid collection. No midline shift. Vascular: Expected proximal arterial flow voids. Skull and upper cervical spine: No focal suspicious marrow lesion. Sinuses/Orbits: Visualized orbits show no acute finding. Mild ethmoid sinus mucosal thickening. Small right mastoid effusion. IMPRESSION: Unremarkable MRI appearance of the brain for age. No evidence of acute intracranial abnormality. Mild ethmoid sinus mucosal thickening. Small right mastoid effusion. Electronically Signed   By: Jackey Loge DO   On: 11/21/2019 16:54    Medications:   . [START ON 11/24/2019] azithromycin  500 mg Oral Daily  .  enoxaparin (LOVENOX) injection  40 mg Subcutaneous Q24H  . gabapentin  300 mg Oral TID  . magnesium oxide  400 mg Oral Daily  . melatonin  5 mg Oral QHS  . pantoprazole  40 mg Oral Daily  . sodium chloride flush  3 mL Intravenous Once  . sulfamethoxazole-trimethoprim  1 tablet Oral Q12H  . verapamil  180 mg Oral QPM   Continuous Infusions:    LOS: 2 days   Joseph Art  [facility]   How to contact the [facility] Attending or Consulting provider 7A - 7P or covering provider during after hours 7P -7A, for this patient?  1. Check the care team in [facility] and look for a) attending/consulting TRH provider listed and b) the [facility] team listed 2. Log into [email or web address] and use [place]'s universal password to access. If you do not have the password, please contact the hospital operator. 3. Locate the [facility] provider you are looking for under [facility] and page to a number that you can be directly reached. 4. If you still have difficulty reaching the provider, please page the [facility] (Director on Call) for the Hospitalists listed on amion for assistance.  11/23/2019, 3:07 PM

## 2019-11-23 NOTE — Progress Notes (Addendum)
Physical Therapy Treatment/Vestibular Assessment  Patient Details Name: Francis Butler. MRN: [id number] DOB: [birth year] Today's Date: 11/23/2019    History of Present Illness Pt is a 44 y/o male admitted secondary to fever and diarrhea. Workup pending. 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     PT Comments    Patient received in bed, pleasant and motivated to participate in PT vestibular assessment today. See below for specific vestibular findings, note he does present with significant central (versus acute peripheral menieres?) vestibular symptoms including but not limited to impaired VOR reflex as well as significant difficulty with maintaining gaze on dynamic objects when challenged. Also found multidirectional nystagmus present with left semont maneuver however semont testing negative for right side. Discussed possible origin of symptoms as well as PT interventions for him to practice while hospitalized, also discussed how intense to "train" with these exercises (and dizziness overall) limiting severity of symptoms to 4-5/10 before taking a break. Strongly encouraged vestibular follow up at [facility] OP PT clinic. He was agreeable to all education provided today. Left in bed with all needs met, bed alarm active.     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 Constant dizziness and ears "full of cotton". Sometimes he is OK and other times it really gets bad for days- states he's had maybe 4-5 good days in the past year. Hx of concussions too with most recent being march 24th. [place] syndrome seems to get worse when Menieres worsens. Saw vestibular therapy at [facility] and they didn't treat/told him there was nothing they could do.    Type of Dizziness  Imbalance;Unsteady with head/body  turns;Vertigo  Frequency of Dizziness has been pretty constant fo rhte past year   Duration of Dizziness days to weeks, recently has been months   Symptom Nature Motion provoked;Spontaneous;Variable  Aggravating Factors Spontaneous onset;Activity in general;Forward bending;Comment (looking down )  Relieving Factors No known relieving factors;Rest  Progression of Symptoms Worse  Oculomotor Exam  Oculomotor Alignment Normal  Ocular ROM WNL with end range nystagmus   Spontaneous Absent  Gaze-induced  Age appropriate nystagmus at end range  Head shaking Horizontal Absent  Head Shaking Vertical Absent  Smooth Pursuits Saccades  Saccades Dysmetria;Poor trajectory;Slow;Undershoots  Vestibulo-Ocular Reflex  VOR 1 Head Only (x 1 viewing) unable to maintain gaze   VOR Cancellation Unable to maintain gaze  Other Tests  Comments Semont manuever negative to the right, caused multidirectional nystagmus and worsening vertigo to the left   Cognition  Cognition Orientation Level Oriented x 4     Follow Up Recommendations  Outpatient PT (vestibular outpatent at [facility] Neuro clinic)     Equipment Recommendations  None recommended by PT    Recommendations for Other Services       Precautions / Restrictions Precautions Precautions: None Restrictions Weight Bearing Restrictions: No    Mobility  Bed Mobility Overal bed mobility: Modified Independent                Transfers                 General transfer comment: focus on vestibular testing  Ambulation/Gait             General Gait Details: focus on vestibular testing   Stairs  Wheelchair Mobility    Modified Rankin (Stroke Patients Only)       Balance Overall balance assessment: Mild deficits observed, not formally tested                                          Cognition Arousal/Alertness: Awake/alert Behavior During Therapy: WFL for tasks assessed/performed;Flat  affect Overall Cognitive Status: Within Functional Limits for tasks assessed                                        Exercises      General Comments General comments (skin integrity, edema, etc.): session focus on vestibular testing and intervention      Pertinent Vitals/Pain Pain Assessment: No/denies pain Pain Score: 0-No pain    Home Living                      Prior Function            PT Goals (current goals can now be found in the care plan section) Acute Rehab PT Goals Patient Stated Goal: to feel better PT Goal Formulation: With patient Time For Goal Achievement: 12/06/19 Potential to Achieve Goals: Good Additional Goals Additional Goal #1: will score >21 on DGI Progress towards PT goals: Progressing toward goals    Frequency    Min 3X/week      PT Plan Current plan remains appropriate    Co-evaluation              AM-PAC PT "6 Clicks" Mobility   Outcome Measure  Help needed turning from your back to your side while in a flat bed without using bedrails?: None Help needed moving from lying on your back to sitting on the side of a flat bed without using bedrails?: None Help needed moving to and from a bed to a chair (including a wheelchair)?: A Little Help needed standing up from a chair using your arms (e.g., wheelchair or bedside chair)?: A Little Help needed to walk in hospital room?: A Little Help needed climbing 3-5 steps with a railing? : A Lot 6 Click Score: 19    End of Session   Activity Tolerance: Patient tolerated treatment well Patient left: in bed;with call bell/phone within reach;with bed alarm set   PT Visit Diagnosis: Unsteadiness on feet (R26.81);Other abnormalities of gait and mobility (R26.89)     Time: 4098-1191 PT Time Calculation (min) (ACUTE ONLY): 40 min  Charges:  $Neuromuscular Re-education: 23-37 mins $Self Care/Home Management: 8-22                     Windell Norfolk, DPT, PN1    Supplemental Physical Therapist [place]    Pager [phone] Acute Rehab Office [phone]

## 2019-11-23 NOTE — Progress Notes (Signed)
Switch ceftriaxone/azith to PO bactrim and azithromycin for salmonella and campylobacter per Dr Benjamine Mola.  Ulyses Southward, PharmD, BCIDP, AAHIVP, CPP Infectious Disease Pharmacist 11/23/2019 10:04 AM

## 2019-11-24 DIAGNOSIS — R42 Dizziness and giddiness: Secondary | ICD-10-CM

## 2019-11-24 MED ORDER — SULFAMETHOXAZOLE-TRIMETHOPRIM 800-160 MG PO TABS: 1.0000 | ORAL_TABLET | Freq: Two times a day (BID) | ORAL | 0 refills | Status: DC

## 2019-11-24 MED ORDER — LISINOPRIL 10 MG PO TABS: 10.0000 mg | ORAL_TABLET | Freq: Every day | ORAL | Status: DC

## 2019-11-24 MED ORDER — AZITHROMYCIN 500 MG PO TABS: 500.0000 mg | ORAL_TABLET | Freq: Every day | ORAL | 0 refills | Status: DC

## 2019-11-24 NOTE — TOC Transition Note (Signed)
Transition of Care [facility]) - CM/SW Discharge Note   Patient Details  Name: Francis Butler. MRN: [id number] Date of Birth: 12-13-1975  [facility]) CM/SW Contact:  Lawerance Sabal, RN Phone Number: 11/24/2019, 10:30 AM   Clinical Narrative:    Spoke w patient to set up outpatient vestibular PT. He is familiar with [facility] and would like services at Neuro Rehab. He confirms that getting to appointments will not be difficult. Referral placed through Epic specifically for vestibular PT. Patient understands to call by Thursday afternoon to make appointment if he has not heard from them by then. He has some exercises from the PT here to do at home until then.     Final next level of care: Home/Self Care Barriers to Discharge: No Barriers Identified   Patient Goals and CMS Choice        Discharge Placement                       Discharge Plan and Services                                     Social Determinants of Health (SDOH) Interventions     Readmission Risk Interventions No flowsheet data found.

## 2019-11-24 NOTE — Discharge Summary (Signed)
Physician Discharge Summary  Francis Butler. EVO:[id number] DOB: 07-Nov-1975 DOA: 11/20/2019  PCP: Assunta Found, MD  Admit date: 11/20/2019 Discharge date: 11/24/2019  Admitted From: home Discharge disposition: home   Recommendations for Outpatient Follow-Up:   Outpatient vestibular rehab  Discharge Diagnosis:   Active Problems:   Fever   Enterocolitis    Discharge Condition: Improved.  Diet recommendation: regular  Wound care: None.  Code status: Full.   History of Present Illness:   Francis Butler. is a 44 y.o. male with medical history significant of Mnire's disease, vestibular vertigo, question of trigeminal neuralgia, presented with worsening of headache vertigo and new onset fever.  His symptoms started about 5 to 7 days ago, gradually getting worse.  He had a similar episode 3 months ago which resolved by its own.  But the patient reported that the fever is something new.  He has been following with neurologist and ENT as outpatient for chronic vertigo and headache problems.  Multiple regimen were tried without success.  And most recently, neurology Dr. Anne Hahn proposed Botox injection which patient scheduled for next month.  This time, patient reported headache has been radiating down to the right side of the neck, and he always has headaches on the right side, and his hearing also deteriorated as he described as stuffed a cotton ball in the ear.  Denies any ear discharge, no trouble to open jaws or bite.  Denies any chills but he decided to check temperature yesterday nonetheless and found temperature was 102.  He denies any cough, no dysuria.  Patient also claims he had loose diarrhea about 5 days ago on and off 1-3 episodes a day, no abdominal pain, and diarrhea disappeared 3 days ago.   Hospital Course by Problem:   Fever suspected source is enterocolitis affecting the distal ileum, ascending and proximal transverse colon preferentially due to campylobacter  and salmonella -no other family members sick -diarrhea symptoms improving -IV abx started - transition to PO (azithro for total of 3 days, bactrim for total with IV of 5 days) -c diff negative  Worsening of vertigo and migraines -No clear etiology, neurology has tried several regimens without success.  -Dr. Anne Hahn managing: getting botox next week -suspect stress is contributing -outpatient vestibular rehab  AKI -Probably from dehydration  HTN -As above.  obesity Body mass index is 41.64 kg/m.      Medical Consultants:      Discharge Exam:   Vitals:   11/23/19 2052 11/24/19 0522  BP: (!) 142/93 (!) 150/101  Pulse: 96 92  Resp: 18 18  Temp: 98.9 F (37.2 C) 98.7 F (37.1 C)  SpO2: 96% 98%   Vitals:   11/23/19 1013 11/23/19 1709 11/23/19 2052 11/24/19 0522  BP: (!) 145/99 (!) 144/95 (!) 142/93 (!) 150/101  Pulse: 93 84 96 92  Resp: 20 17 18 18   Temp: 98.6 F (37 C) 98.5 F (36.9 C) 98.9 F (37.2 C) 98.7 F (37.1 C)  TempSrc: Oral Oral Oral Oral  SpO2: 95% 97% 96% 98%  Weight:        General exam: Appears calm and comfortable.  The results of significant diagnostics from this hospitalization (including imaging, microbiology, ancillary and laboratory) are listed below for reference.     Procedures and Diagnostic Studies:   MR BRAIN WO CONTRAST  Result Date: 11/21/2019 CLINICAL DATA:  Headache, acute, normal neuro exam. EXAM: MRI HEAD WITHOUT CONTRAST TECHNIQUE: Multiplanar, multiecho pulse sequences of the  brain and surrounding structures were obtained without intravenous contrast. COMPARISON:  Brain MRI 05/29/2014, head CT 07/14/2014. FINDINGS: Brain: Mild intermittent motion degradation. Cerebral volume is normal. There is no significant white matter disease for age. There is no acute infarct. No evidence of intracranial mass. No chronic intracranial blood products. No extra-axial fluid collection. No midline shift. Vascular: Expected proximal  arterial flow voids. Skull and upper cervical spine: No focal suspicious marrow lesion. Sinuses/Orbits: Visualized orbits show no acute finding. Mild ethmoid sinus mucosal thickening. Small right mastoid effusion. IMPRESSION: Unremarkable MRI appearance of the brain for age. No evidence of acute intracranial abnormality. Mild ethmoid sinus mucosal thickening. Small right mastoid effusion. Electronically Signed   By: Jackey Loge DO   On: 11/21/2019 16:54   CT ABDOMEN PELVIS W CONTRAST  Result Date: 11/21/2019 CLINICAL DATA:  Abdominal pain and fever EXAM: CT ABDOMEN AND PELVIS WITH CONTRAST TECHNIQUE: Multidetector CT imaging of the abdomen and pelvis was performed using the standard protocol following bolus administration of intravenous contrast. CONTRAST:  OMNIPAQUE IOHEXOL 350 MG/ML SOLN COMPARISON:  None FINDINGS: Lower chest: Tiny pulmonary nodule at the LEFT lung base (image 3, series 4) small nodule, perhaps along the minor fissure in the RIGHT chest 3 mm (image 1,, series 3) no consolidation. No pleural effusion. Hepatobiliary: No focal, suspicious hepatic lesion. Hepatic steatosis. Portal vein is patent. No pericholecystic stranding. Pancreas: Pancreas is normal. Spleen: Spleen at upper limits of normal for size. Adrenals/Urinary Tract: Adrenal glands are normal Symmetric renal enhancement. Smooth renal contours. Distension of the urinary bladder reaching the low abdomen. No hydronephrosis. Stomach/Bowel: Small hiatal hernia. Stomach is under distended limiting assessment. Mural stratification of the terminal ileum with mild distal and terminal ileal stranding. No small bowel obstruction. The appendix is normal. Signs of colitis affecting the ascending and proximal transverse colon preferentially. Stool fills the remainder of the colon. Vascular/Lymphatic: Portal vein and SMV are patent.  SMA is patent. No adenopathy. Retroaortic LEFT renal vein. No atherosclerotic plaque or aneurysmal dilation of  the abdominal aorta. No pelvic lymphadenopathy. Reproductive: Prostate unremarkable by CT. Other: Small fat containing umbilical hernia. Musculoskeletal: Signs of old trauma to the LEFT hemithorax with healed rib fractures. IMPRESSION: 1. Signs of enterocolitis affecting the distal ileum, ascending and proximal transverse colon preferentially. Correlate with any risk factors for infectious colitis such is recent antibiotic administration. Distribution is nonspecific and could also be seen in the setting of inflammatory bowel disease. 2. Hepatic steatosis with mildly enlarged spleen. Correlate with any clinical or laboratory evidence of liver disease. 3. Marked distension of the urinary bladder extending into the low abdomen. Finding could be seen in the setting of bladder outlet obstruction. No hydronephrosis. 4. Small hiatal hernia. 5. Small fat containing umbilical hernia. 6. Signs of old trauma to the LEFT hemithorax with healed rib fractures. 7. Aortic atherosclerosis. Aortic Atherosclerosis (ICD10-I70.0). Electronically Signed   By: Donzetta Kohut M.D.   On: 11/21/2019 09:09   DG Chest Port 1 View  Result Date: 11/21/2019 CLINICAL DATA:  Fever with nausea and vomiting EXAM: PORTABLE CHEST 1 VIEW COMPARISON:  03/20/2014 FINDINGS: Normal heart size and mediastinal contours. No acute infiltrate or edema. No effusion or pneumothorax. No acute osseous findings. Remote left rib fractures. Artifact from EKG leads. IMPRESSION: Negative for pneumonia. Electronically Signed   By: Marnee Spring M.D.   On: 11/21/2019 05:59     Labs:   Basic Metabolic Panel: Recent Labs  Lab 11/20/19 2207 11/20/19 2207 11/22/19 0531 11/23/19  0357  NA 136  --  138 140  K 3.7   < > 3.6 4.6  CL 103  --  107 107  CO2 23  --  22 22  GLUCOSE 109*  --  113* 108*  BUN 9  --  10 9  CREATININE 1.49*  --  1.09 1.26*  CALCIUM 8.8*  --  8.3* 8.5*   < > = values in this interval not displayed.   GFR Estimated Creatinine  Clearance: 87.1 mL/min (A) (by C-G formula based on SCr of 1.26 mg/dL (H)). Liver Function Tests: Recent Labs  Lab 11/20/19 2207  AST 19  ALT 24  ALKPHOS 61  BILITOT 0.8  PROT 6.6  ALBUMIN 4.0   Recent Labs  Lab 11/20/19 2207  LIPASE 26   No results for input(s): AMMONIA in the last 168 hours. Coagulation profile No results for input(s): INR, PROTIME in the last 168 hours.  CBC: Recent Labs  Lab 11/20/19 2207 11/22/19 0531 11/23/19 0357  WBC 15.7* 11.0* 8.5  HGB 15.3 14.9 14.5  HCT 45.9 44.6 43.8  MCV 85.5 86.8 87.3  PLT 278 215 232   Cardiac Enzymes: No results for input(s): CKTOTAL, CKMB, CKMBINDEX, TROPONINI in the last 168 hours. BNP: Invalid input(s): POCBNP CBG: No results for input(s): GLUCAP in the last 168 hours. D-Dimer No results for input(s): DDIMER in the last 72 hours. Hgb A1c No results for input(s): HGBA1C in the last 72 hours. Lipid Profile No results for input(s): CHOL, HDL, LDLCALC, TRIG, CHOLHDL, LDLDIRECT in the last 72 hours. Thyroid function studies No results for input(s): TSH, T4TOTAL, T3FREE, THYROIDAB in the last 72 hours.  Invalid input(s): FREET3 Anemia work up No results for input(s): VITAMINB12, FOLATE, FERRITIN, TIBC, IRON, RETICCTPCT in the last 72 hours. Microbiology Recent Results (from the past 240 hour(s))  Urine Culture     Status: None   Collection Time: 11/20/19 10:00 PM   Specimen: Urine, Random  Result Value Ref Range Status   Specimen Description URINE, RANDOM  Final   Special Requests NONE  Final   Culture   Final    NO GROWTH Performed at [facility], [address] [place], [place] [address]    Report Status 11/22/2019 FINAL  Final  Culture, blood (Routine X 2) w Reflex to ID Panel     Status: None (Preliminary result)   Collection Time: 11/21/19  6:03 AM   Specimen: BLOOD  Result Value Ref Range Status   Specimen Description BLOOD LEFT ANTECUBITAL  Final   Special Requests   Final    BOTTLES  DRAWN AEROBIC AND ANAEROBIC Blood Culture results may not be optimal due to an inadequate volume of blood received in culture bottles   Culture   Final    NO GROWTH 2 DAYS Performed at [facility], [address] [place], [place] [address]    Report Status PENDING  Incomplete  SARS Coronavirus 2 by RT PCR (hospital order, performed in [facility] hospital lab) Nasopharyngeal Nasopharyngeal Swab     Status: None   Collection Time: 11/21/19  6:13 AM   Specimen: Nasopharyngeal Swab  Result Value Ref Range Status   SARS Coronavirus 2 NEGATIVE NEGATIVE Final    Comment: (NOTE) SARS-CoV-2 target nucleic acids are NOT DETECTED.  The SARS-CoV-2 RNA is generally detectable in upper and lower respiratory specimens during the acute phase of infection. The lowest concentration of SARS-CoV-2 viral copies this assay can detect is 250 copies /  mL. A negative result does not preclude SARS-CoV-2 infection and should not be used as the sole basis for treatment or other patient management decisions.  A negative result may occur with improper specimen collection / handling, submission of specimen other than nasopharyngeal swab, presence of viral mutation(s) within the areas targeted by this assay, and inadequate number of viral copies (<250 copies / mL). A negative result must be combined with clinical observations, patient history, and epidemiological information.  Fact Sheet for Patients:   BoilerBrush.com.cy  Fact Sheet for Healthcare Providers: https://pope.com/  This test is not yet approved or  cleared by the [place] FDA and has been authorized for detection and/or diagnosis of SARS-CoV-2 by FDA under an Emergency Use Authorization (EUA).  This EUA will remain in effect (meaning this test can be used) for the duration of the COVID-19 declaration under Section 564(b)(1) of the Act, 21 U.S.C. section 360bbb-3(b)(1), unless the  authorization is terminated or revoked sooner.  Performed at [facility], [address] [place], [place] [address]   Culture, blood (Routine X 2) w Reflex to ID Panel     Status: None (Preliminary result)   Collection Time: 11/21/19  9:05 AM   Specimen: BLOOD  Result Value Ref Range Status   Specimen Description BLOOD RIGHT ANTECUBITAL  Final   Special Requests   Final    BOTTLES DRAWN AEROBIC AND ANAEROBIC Blood Culture results may not be optimal due to an inadequate volume of blood received in culture bottles   Culture   Final    NO GROWTH 2 DAYS Performed at [facility], [address], [place], [place] [address]    Report Status PENDING  Incomplete  Gastrointestinal Panel by PCR , Stool     Status: Abnormal   Collection Time: 11/21/19 11:20 AM   Specimen: STOOL  Result Value Ref Range Status   Campylobacter species DETECTED (A) NOT DETECTED Final    Comment: CRITICAL RESULT CALLED TO, READ BACK BY AND VERIFIED WITH: KIM GENGLER  12/03/2019 TTG    Plesimonas shigelloides NOT DETECTED NOT DETECTED Final   Salmonella species DETECTED (A) NOT DETECTED Final    Comment: CRITICAL RESULT CALLED TO, READ BACK BY AND VERIFIED WITH: KIM GENGLER  11/23/2019 TTG    Yersinia enterocolitica NOT DETECTED NOT DETECTED Final   Vibrio species NOT DETECTED NOT DETECTED Final   Vibrio cholerae NOT DETECTED NOT DETECTED Final   Enteroaggregative E coli (EAEC) NOT DETECTED NOT DETECTED Final   Enteropathogenic E coli (EPEC) NOT DETECTED NOT DETECTED Final   Enterotoxigenic E coli (ETEC) NOT DETECTED NOT DETECTED Final   Shiga like toxin producing E coli (STEC) NOT DETECTED NOT DETECTED Final   Shigella/Enteroinvasive E coli (EIEC) NOT DETECTED NOT DETECTED Final   Cryptosporidium NOT DETECTED NOT DETECTED Final   Cyclospora cayetanensis NOT DETECTED NOT DETECTED Final   Entamoeba histolytica NOT DETECTED NOT DETECTED Final   Giardia lamblia NOT DETECTED NOT DETECTED Final    Adenovirus F40/41 NOT DETECTED NOT DETECTED Final   Astrovirus NOT DETECTED NOT DETECTED Final   Norovirus GI/GII NOT DETECTED NOT DETECTED Final   Rotavirus A NOT DETECTED NOT DETECTED Final   Sapovirus (I, II, IV, and V) NOT DETECTED NOT DETECTED Final    Comment: Performed at [facility], [address], [place], [place] [address]  C Difficile Quick Screen w PCR reflex     Status: None   Collection Time: 11/21/19 11:24 PM   Specimen:  STOOL  Result Value Ref Range Status   C Diff antigen NEGATIVE NEGATIVE Final   C Diff toxin NEGATIVE NEGATIVE Final   C Diff interpretation No C. difficile detected.  Final    Comment: Performed at [facility], [address] [place], [place] [address]     Discharge Instructions:   Discharge Instructions    Discharge instructions   Complete by: As directed    Bland diet   Increase activity slowly   Complete by: As directed      Allergies as of 11/24/2019      Reactions   Bee Pollen Swelling   Scopolamine Other (See Comments)   Pupils to dilate   Penicillins Hives      Medication List    STOP taking these medications   ibuprofen 200 MG tablet Commonly known as: ADVIL   metoCLOPramide 10 MG tablet Commonly known as: REGLAN   ondansetron 4 MG tablet Commonly known as: ZOFRAN     TAKE these medications   azithromycin 500 MG tablet Commonly known as: ZITHROMAX Take 1 tablet (500 mg total) by mouth daily.   B2 PO Take 100 mg by mouth in the morning and at bedtime.   cyclobenzaprine 10 MG tablet Commonly known as: FLEXERIL Take 10 mg by mouth in the morning and at bedtime.   diazepam 5 MG tablet Commonly known as: Valium Take 1 tablet (5 mg total) by mouth every 6 (six) hours as needed for anxiety.   gabapentin 300 MG capsule Commonly known as: NEURONTIN 1 capsule twice daily for 2 weeks then take 1 in the morning and 2 in the evening What changed:   how much to take  how to take this  when to take  this  additional instructions   lisinopril 10 MG tablet Commonly known as: ZESTRIL Take 1 tablet (10 mg total) by mouth daily. Start taking on: November 28, 2019 What changed: These instructions start on November 28, 2019. If you are unsure what to do until then, ask your doctor or other care provider.   MAGNESIUM PO Take 500 mg by mouth.   MELATONIN PO Take 5 mg by mouth at bedtime.   omeprazole 20 MG capsule Commonly known as: PRILOSEC Take 20 mg by mouth daily.   ondansetron 8 MG disintegrating tablet Commonly known as: ZOFRAN-ODT Take 1 tablet (8 mg total) by mouth every 8 (eight) hours as needed for nausea or vomiting.   rizatriptan 10 MG tablet Commonly known as: MAXALT Take 1 tablet (10 mg total) by mouth 3 (three) times daily as needed (For dizziness). What changed: when to take this   sulfamethoxazole-trimethoprim 800-160 MG tablet Commonly known as: BACTRIM DS Take 1 tablet by mouth every 12 (twelve) hours.   verapamil 180 MG CR tablet Commonly known as: CALAN-SR Take 180 mg by mouth every evening.         Time coordinating discharge: 35 min  Signed:  Joseph Art DO  [facility] 11/24/2019, 9:20 AM

## 2019-11-24 NOTE — Progress Notes (Signed)
DISCHARGE NOTE HOME Francis Butler. to be discharged Home per MD order. Patient tolerated lunch meal well. Discussed prescriptions and follow up appointments with the patient. Prescriptions given to patient; medication list explained in detail. Patient verbalized understanding.  Skin clean, dry and intact without evidence of skin break down, no evidence of skin tears noted. IV catheter discontinued intact. Site without signs and symptoms of complications. Dressing and pressure applied. Pt denies pain at the site currently. No complaints noted.  Patient free of lines, drains, and wounds.   An After Visit Summary (AVS) was printed and given to the patient. Patient escorted via wheelchair, and discharged home via private auto.  Carolann Brazell, Resa Miner, RN

## 2019-11-24 NOTE — Discharge Instructions (Signed)
Recommend trying a pro-biotic yogurt like Activia or other yogurt that says "Probiotic" on the front label.

## 2019-11-26 ENCOUNTER — Ambulatory Visit: Payer: BC Managed Care – PPO | Admitting: Neurology

## 2019-11-26 ENCOUNTER — Other Ambulatory Visit: Payer: Self-pay

## 2019-11-26 ENCOUNTER — Encounter: Payer: Self-pay | Admitting: Neurology

## 2019-11-26 VITALS — BP 145/92 | HR 102 | Wt 248.0 lb

## 2019-11-26 DIAGNOSIS — G43519 Persistent migraine aura without cerebral infarction, intractable, without status migrainosus: Secondary | ICD-10-CM

## 2019-11-26 LAB — CULTURE, BLOOD (ROUTINE X 2)
Culture: NO GROWTH
Culture: NO GROWTH

## 2019-11-26 MED ORDER — ONABOTULINUMTOXINA 100 UNITS IJ SOLR
200.0000 [IU] | Freq: Once | INTRAMUSCULAR | Status: AC
  Administered 2019-11-26: 200 [IU] via INTRAMUSCULAR

## 2019-11-26 NOTE — Procedures (Signed)
     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  The patient is being treated for intractable migraine headache.  He is getting his first Botox injection today.   Description of procedure:  The patient was placed in a sitting position. The standard protocol was used for Botox as follows, with 5 units of Botox injected at each site:   -Procerus muscle, midline injection  -Corrugator muscle, bilateral injection  -Frontalis muscle, bilateral injection, with 2 sites each side, medial injection was performed in the upper one third of the frontalis muscle, in the region vertical from the medial inferior edge of the superior orbital rim. The lateral injection was again in the upper one third of the forehead vertically above the lateral limbus of the cornea, 1.5 cm lateral to the medial injection site.  -Temporalis muscle injection, 4 sites, bilaterally. The first injection was 3 cm above the tragus of the ear, second injection site was 1.5 cm to 3 cm up from the first injection site in line with the tragus of the ear. The third injection site was 1.5-3 cm forward between the first 2 injection sites. The fourth injection site was 1.5 cm posterior to the second injection site.  -Occipitalis muscle injection, 3 sites, bilaterally. The first injection was done one half way between the occipital protuberance and the tip of the mastoid process behind the ear. The second injection site was done lateral and superior to the first, 1 fingerbreadth from the first injection. The third injection site was 1 fingerbreadth superiorly and medially from the first injection site.  -Cervical paraspinal muscle injection, 2 sites, bilateral, the first injection site was 1 cm from the midline of the cervical spine, 3 cm inferior to the lower border of  the occipital protuberance. The second injection site was 1.5 cm superiorly and laterally to the first injection site.  -Trapezius muscle injection was performed at 3 sites, bilaterally. The first injection site was in the upper trapezius muscle halfway between the inflection point of the neck, and the acromion. The second injection site was one half way between the acromion and the first injection site. The third injection was done between the first injection site and the inflection point of the neck.   A 200 unit bottle of Botox was used, 155 units were injected, the rest of the Botox was wasted. The patient tolerated the procedure well, there were no complications of the above procedure.  Botox NDC 8032-1224-82 Lot number [id number] Expiration date January 2024

## 2019-11-26 NOTE — Progress Notes (Signed)
Botox 200 units one vial Expires 05/2022 Lot number [id number] NDC 0023 [id number] 02 Buy and bill for patient

## 2019-12-03 ENCOUNTER — Other Ambulatory Visit: Payer: Self-pay | Admitting: Neurology

## 2019-12-03 MED ORDER — CYCLOBENZAPRINE HCL 10 MG PO TABS: 10.0000 mg | ORAL_TABLET | Freq: Two times a day (BID) | ORAL | 5 refills | Status: DC

## 2019-12-16 ENCOUNTER — Ambulatory Visit: Payer: BC Managed Care – PPO | Admitting: Physical Therapy

## 2019-12-24 ENCOUNTER — Other Ambulatory Visit: Payer: Self-pay | Admitting: Neurology

## 2019-12-24 MED ORDER — PREDNISONE 10 MG PO TABS
ORAL_TABLET | ORAL | 0 refills | Status: DC
Start: 2019-12-24 — End: 2020-04-15

## 2019-12-31 ENCOUNTER — Ambulatory Visit: Payer: BC Managed Care – PPO | Admitting: Physical Therapy

## 2020-01-02 DIAGNOSIS — Z0289 Encounter for other administrative examinations: Secondary | ICD-10-CM

## 2020-01-08 ENCOUNTER — Telehealth: Payer: Self-pay | Admitting: Neurology

## 2020-01-08 ENCOUNTER — Other Ambulatory Visit: Payer: Self-pay | Admitting: Neurology

## 2020-01-08 NOTE — Telephone Encounter (Signed)
FMLA paperwork faxed 01/07/2020.

## 2020-01-10 ENCOUNTER — Other Ambulatory Visit: Payer: Self-pay | Admitting: Neurology

## 2020-01-10 MED ORDER — CHLORPROMAZINE HCL 50 MG PO TABS
50.0000 mg | ORAL_TABLET | Freq: Two times a day (BID) | ORAL | 1 refills | Status: DC
Start: 2020-01-10 — End: 2020-02-25

## 2020-01-21 ENCOUNTER — Ambulatory Visit (HOSPITAL_COMMUNITY): Payer: BC Managed Care – PPO | Admitting: Physical Therapy

## 2020-01-30 ENCOUNTER — Other Ambulatory Visit: Payer: BC Managed Care – PPO

## 2020-01-30 ENCOUNTER — Other Ambulatory Visit: Payer: Self-pay

## 2020-01-30 DIAGNOSIS — Z20822 Contact with and (suspected) exposure to covid-19: Secondary | ICD-10-CM

## 2020-02-01 LAB — NOVEL CORONAVIRUS, NAA: SARS-CoV-2, NAA: NOT DETECTED

## 2020-02-01 LAB — SARS-COV-2, NAA 2 DAY TAT

## 2020-02-22 ENCOUNTER — Other Ambulatory Visit: Payer: Self-pay | Admitting: Neurology

## 2020-02-23 ENCOUNTER — Other Ambulatory Visit: Payer: Self-pay | Admitting: Neurology

## 2020-02-24 ENCOUNTER — Telehealth: Payer: Self-pay | Admitting: Neurology

## 2020-02-24 NOTE — Telephone Encounter (Signed)
Patient has Botox appointment scheduled for 10/13. Patient has current PA on file for Botox with [employer] (11/05/19- 05/02/20). I will fill out a continuation PA request and add [facility] for future Botox injections.

## 2020-02-25 ENCOUNTER — Other Ambulatory Visit: Payer: Self-pay | Admitting: *Deleted

## 2020-02-25 ENCOUNTER — Encounter: Payer: Self-pay | Admitting: *Deleted

## 2020-02-25 ENCOUNTER — Telehealth: Payer: Self-pay | Admitting: Neurology

## 2020-02-25 NOTE — Telephone Encounter (Signed)
Please call patient and advise him to seek more urgent evaluation through either urgent care or try to get in to see his primary care physician.  It sounds like he is having several problems that have affected him including pain in the neck and shoulder, pain in the ear, vertigo spells, side effects from medication.  I think he needs to be seen.  He has filled the diazepam less than a month ago and I cannot justify a new prescription at this time but he should talk to his primary care physician about it.

## 2020-02-25 NOTE — Telephone Encounter (Signed)
The patient and I have been sending mychart messages. I have sent him a mychart message relaying Dr. Teofilo Pod message below.

## 2020-03-03 ENCOUNTER — Telehealth: Payer: Self-pay | Admitting: Neurology

## 2020-03-03 ENCOUNTER — Other Ambulatory Visit: Payer: Self-pay

## 2020-03-03 ENCOUNTER — Encounter: Payer: Self-pay | Admitting: Neurology

## 2020-03-03 ENCOUNTER — Ambulatory Visit: Payer: BC Managed Care – PPO | Admitting: Neurology

## 2020-03-03 VITALS — BP 145/95 | HR 104 | Ht 65.0 in | Wt 253.0 lb

## 2020-03-03 DIAGNOSIS — R42 Dizziness and giddiness: Secondary | ICD-10-CM

## 2020-03-03 DIAGNOSIS — M542 Cervicalgia: Secondary | ICD-10-CM | POA: Diagnosis not present

## 2020-03-03 DIAGNOSIS — G43519 Persistent migraine aura without cerebral infarction, intractable, without status migrainosus: Secondary | ICD-10-CM

## 2020-03-03 MED ORDER — LAMOTRIGINE 25 MG PO TABS: ORAL_TABLET | ORAL | 1 refills | Status: DC

## 2020-03-03 NOTE — Patient Instructions (Signed)
We will start Lamictal for the headache.  Lamictal (lamotrigine) is a seizure medication that occasionally may be used for other purposes such as peripheral neuropathy pain or certain types of headache. This medication is relatively safe, but occasionally side effects can occur. A skin rash may occur when first starting the medication. As with any seizure medication, depression may worsen on the drug. Other potential side effects include dizziness, headache, drowsiness or insomnia, decreased concentration, or stomach upset. This medication may also be used as a mood stabilizer. If you believe that you are having side effects on the medication, please contact our office.

## 2020-03-03 NOTE — Telephone Encounter (Signed)
Pt called, need a letter for leave of absent from work. Leave of absent to start tomorrow after Botox appt. Would like a call from the nurse.

## 2020-03-03 NOTE — Telephone Encounter (Signed)
I will dictate a letter for the patient. 

## 2020-03-03 NOTE — Progress Notes (Signed)
Reason for visit: Vertigo, atypical facial pain on Francis right  Francis Butler. is an 44 y.o. male  History of present illness:  Francis Butler is a 44 year old right-handed white male with a history of vertigo and constant right V2 distribution pain that has been present since 2016.  Francis Butler has not gained any benefit with multiple therapies for Francis vertigo and for Francis headache.  He was received at a dose of Botox in July 2021, he did not gain any benefit from this.  He was given Thorazine as a rescue drug for Francis symptoms, but this resulted in severe sedation given at a 50 mg dosing regimen.  Francis Butler does have neck stiffness on Francis right and discomfort going down into Francis right shoulder, he is getting dry needling for this with some benefit.  He has some restriction of rotational movement of Francis cervical spine.  He reports Francis vertigo will come and go, he sometimes get benefit from diazepam, Francis dose was recently increased to 10 mg dose.  He is having trouble coping at work, he is missing a lot of time at work, and is considering going on short-term disability.  Francis Butler reports no pain down Francis arms on either side.  Zofran may help some with Francis nausea, he rarely has vomiting.  He reports no double vision but he has some distortion of vision with hallways that look like they are tilted at times.  He has had MRI studies of Francis brain on 2 occasions that were unremarkable, he has never had MRI of Francis cervical spine.  He occasionally have some stuttering speech.  Francis right facial pain is constant in nature, dull achy pain without shooting quality.  He reports that he may have some flushing of Francis face on that side that occurs.  Past Medical History:  Diagnosis Date  . Diabetes mellitus without complication (HCC)   . GERD (gastroesophageal reflux disease)   . Headache   . HTN (hypertension)   . OSA on CPAP     Past Surgical History:  Procedure Laterality Date  . wisdom teeth removal        Family History  Problem Relation Age of Onset  . Hypertension Father     Social history:  reports that he has never smoked. He has never used smokeless tobacco. He reports that he does not drink alcohol and does not use drugs.    Allergies  Allergen Reactions  . Bee Pollen Swelling  . Scopolamine Other (See Comments)    Pupils to dilate  . Penicillins Hives  . Thorazine [Chlorpromazine]     "I was unable to wake up after taking Francis medication. My parents tried to wake me several times.  They reported I lost color and was extremely lethargic"    Medications:  Prior to Admission medications   Medication Sig Start Date End Date Taking? Authorizing Provider  azithromycin (ZITHROMAX) 500 MG tablet Take 1 tablet (500 mg total) by mouth daily. 11/24/19  Yes Vann, Jessica U, DO  cyclobenzaprine (FLEXERIL) 10 MG tablet Take 1 tablet (10 mg total) by mouth in Francis morning and at bedtime. 12/03/19  Yes York Spaniel, MD  diazepam (VALIUM) 10 MG tablet Take 1 tablet by mouth every 6 (six) hours as needed. Pt reports hes taking BID 02/26/20  Yes [provider]  gabapentin (NEURONTIN) 300 MG capsule 1 CAPSULE IN Francis MORNING AND 2 CAPSULES IN Francis EVENING 02/24/20  Yes Stephanie Acre  K, MD  lisinopril (ZESTRIL) 10 MG tablet Take 1 tablet (10 mg total) by mouth daily. 11/28/19  Yes Vann, Jessica U, DO  MAGNESIUM PO Take 500 mg by mouth.   Yes [provider]  MELATONIN PO Take 5 mg by mouth at bedtime.    Yes [provider]  omeprazole (PRILOSEC) 20 MG capsule Take 20 mg by mouth daily.   Yes [provider]  ondansetron (ZOFRAN-ODT) 8 MG disintegrating tablet Take 1 tablet (8 mg total) by mouth every 8 (eight) hours as needed for nausea or vomiting. 11/21/19  Yes York Spaniel, MD  predniSONE (DELTASONE) 10 MG tablet Begin taking 6 tablets daily, taper by one tablet daily until off Francis medication. 12/24/19  Yes York Spaniel, MD  Riboflavin (B2 PO) Take 100  mg by mouth in Francis morning and at bedtime.   Yes [provider]  rizatriptan (MAXALT) 10 MG tablet Take 1 tablet (10 mg total) by mouth 3 (three) times daily as needed (For dizziness). 11/21/19  Yes York Spaniel, MD  sulfamethoxazole-trimethoprim (BACTRIM DS) 800-160 MG tablet Take 1 tablet by mouth every 12 (twelve) hours. 11/24/19  Yes Vann, Jessica U, DO  verapamil (CALAN-SR) 180 MG CR tablet Take 180 mg by mouth every evening.   Yes [provider]    ROS:  Out of a complete 14 system review of symptoms, Francis Butler complains only of Francis following symptoms, and all other reviewed systems are negative.  Headache, vertigo Gait instability, occasional falls  Blood pressure (!) 145/95, pulse (!) 104, height 5\' 5"  (1.651 m), weight 253 lb (114.8 kg).  Physical Exam  General: Francis Butler is alert and cooperative at Francis time of Francis examination.  Francis Butler is moderately obese.  Neuromuscular: Francis Butler lacks about 20 degrees of full lateral rotation of Francis cervical spine bilaterally.  No crepitus is noted in Francis temporomandibular joints.  Skin: No significant peripheral edema is noted.   Neurologic Exam  Mental status: Francis Butler is alert and oriented x 3 at Francis time of Francis examination. Francis Butler has apparent normal recent and remote memory, with an apparently normal attention span and concentration ability.   Cranial nerves: Facial symmetry is present. Speech is normal, no aphasia or dysarthria is noted. Extraocular movements are full. Visual fields are full.  There is some sensitivity to soft touch on Francis right mid face.  Motor: Francis Butler has good strength in all 4 extremities.  Sensory examination: Soft touch sensation is symmetric on Francis arms and legs.  Coordination: Francis Butler has good finger-nose-finger and heel-to-shin bilaterally.  Gait and station: Francis Butler has a normal gait. Tandem gait is unsteady.  Romberg is positive, Francis Butler tends to  fall backwards. No drift is seen.  Reflexes: Deep tendon reflexes are symmetric.   Assessment/Plan:  1.  Chronic vertigo  2.  Atypical facial pain, right V2 distribution  Francis Butler unfortunately has not responded to Botox therapy on Francis first dose, he will come in tomorrow for another dose.  If this is not effective, we will not continue Francis medication.  Francis Butler will be started on lamotrigine working up eventually to 150 mg twice daily.  He will be sent for MRI of Francis cervical spine.  We will follow up here in 4 months.  If he does not seem to respond to Francis medical therapies above in Francis next couple months, I will set up a referral to [facility], Dr. [facility]  [place].  Marlan Palau MD 03/03/2020 7:24 AM  [facility] [address] [place], [place] [address]  Phone [phone] Fax [phone]

## 2020-03-03 NOTE — Telephone Encounter (Signed)
FYI for patient's botox appointment Wednesday 03/04/20  Called patient as requested to discuss letter for leave of absence Stated he was coming in to see Dr. Anne Hahn on Wednesday 10/13 and would discuss it further with him, however, he is wanting a leave of absence that will take him through long enough to get disability from work, and he works for a school system, will need at least 90 days to get through the Christmas Holidays.  York Spaniel he is having a hard time working with headaches.

## 2020-03-03 NOTE — Telephone Encounter (Signed)
no to the covid questions MR Cervical spine wo contrast Dr. Lockie Mola Auth: [id number] (exp. 03/03/20 to 08/29/20) Patient is scheduled at [facility] for 03/17/20 because it had to be 10 days after his Botox appt from 03/04/20 due to insurance reasoning.

## 2020-03-04 ENCOUNTER — Ambulatory Visit: Payer: BC Managed Care – PPO | Admitting: Neurology

## 2020-03-04 ENCOUNTER — Encounter: Payer: Self-pay | Admitting: Neurology

## 2020-03-04 VITALS — BP 155/89 | HR 90 | Ht 65.0 in | Wt 250.0 lb

## 2020-03-04 DIAGNOSIS — G43519 Persistent migraine aura without cerebral infarction, intractable, without status migrainosus: Secondary | ICD-10-CM

## 2020-03-04 MED ORDER — ONABOTULINUMTOXINA 100 UNITS IJ SOLR
200.0000 [IU] | Freq: Once | INTRAMUSCULAR | Status: AC
Start: 2020-03-04 — End: 2020-03-04
  Administered 2020-03-04: 200 [IU] via INTRAMUSCULAR

## 2020-03-04 NOTE — Procedures (Signed)
     BOTOX PROCEDURE NOTE FOR MIGRAINE HEADACHE   HISTORY: Francis Butler is a 44 year old gentleman with a history of intractable headaches and difficulty with vertigo.  The patient has migrainous features with his head pain.  He comes in for second Botox injection.  The first injection unfortunate did not offer a lot of benefit, but we are continuing treatment to see if a second injection does offer any benefit.   Description of procedure:  The patient was placed in a sitting position. The standard protocol was used for Botox as follows, with 5 units of Botox injected at each site:   -Procerus muscle, midline injection  -Corrugator muscle, bilateral injection  -Frontalis muscle, bilateral injection, with 2 sites each side, medial injection was performed in the upper one third of the frontalis muscle, in the region vertical from the medial inferior edge of the superior orbital rim. The lateral injection was again in the upper one third of the forehead vertically above the lateral limbus of the cornea, 1.5 cm lateral to the medial injection site.  -Temporalis muscle injection, 4 sites, bilaterally. The first injection was 3 cm above the tragus of the ear, second injection site was 1.5 cm to 3 cm up from the first injection site in line with the tragus of the ear. The third injection site was 1.5-3 cm forward between the first 2 injection sites. The fourth injection site was 1.5 cm posterior to the second injection site.  -Occipitalis muscle injection, 3 sites, bilaterally. The first injection was done one half way between the occipital protuberance and the tip of the mastoid process behind the ear. The second injection site was done lateral and superior to the first, 1 fingerbreadth from the first injection. The third injection site was 1 fingerbreadth superiorly and medially from the first injection site.  -Cervical paraspinal muscle injection, 2 sites, bilateral, the first injection site was 1  cm from the midline of the cervical spine, 3 cm inferior to the lower border of the occipital protuberance. The second injection site was 1.5 cm superiorly and laterally to the first injection site.  -Trapezius muscle injection was performed at 3 sites, bilaterally. The first injection site was in the upper trapezius muscle halfway between the inflection point of the neck, and the acromion. The second injection site was one half way between the acromion and the first injection site. The third injection was done between the first injection site and the inflection point of the neck.   A 200 unit bottle of Botox was used, 155 units were injected, the rest of the Botox was wasted. The patient tolerated the procedure well, there were no complications of the above procedure.  Botox NDC 6237-6283-15 Lot number [id number] Expiration date April 2024

## 2020-03-04 NOTE — Progress Notes (Signed)
Please refer to Botox procedure note. 

## 2020-03-05 DIAGNOSIS — Z0271 Encounter for disability determination: Secondary | ICD-10-CM

## 2020-03-10 ENCOUNTER — Telehealth: Payer: Self-pay | Admitting: *Deleted

## 2020-03-10 ENCOUNTER — Telehealth: Payer: Self-pay | Admitting: Neurology

## 2020-03-10 NOTE — Telephone Encounter (Signed)
I filled out and faxed new [employer] PA form to add [facility]. Current PA through [facility] PA #[id number] (11/05/19- 05/02/20) is for B/B. New PA #[id number] (03/09/20- 03/08/21) is for [facility]. Per MD, patient did not get relief from first Botox injection. Patient just had second injection on 10/13. MD will see how patient benefits from this injection and decide if patient will receive further injections.

## 2020-03-10 NOTE — Telephone Encounter (Signed)
Pt forms @ front desk for p/u

## 2020-03-17 ENCOUNTER — Other Ambulatory Visit: Payer: Self-pay

## 2020-03-17 ENCOUNTER — Ambulatory Visit (INDEPENDENT_AMBULATORY_CARE_PROVIDER_SITE_OTHER): Payer: BC Managed Care – PPO

## 2020-03-17 DIAGNOSIS — M542 Cervicalgia: Secondary | ICD-10-CM | POA: Diagnosis not present

## 2020-03-19 ENCOUNTER — Telehealth: Payer: Self-pay | Admitting: Neurology

## 2020-03-19 NOTE — Telephone Encounter (Signed)
  I called the patient.  The MRI of the cervical spine shows multilevel facet joint arthritis with moderate stenosis on the right at the C6-7 and see 7 T1 levels.  Patient is having some right-sided neck and shoulder discomfort.  I suggested a an epidural steroid injection in the neck, the patient does not wish to have the procedure currently, but he will call me if he decides he wants to pursue this.  MRI cervical 03/19/20:  IMPRESSION:   MRI cervical spine (without) demonstrating: - At C5-6 uncovertebral joint and facet hypertrophy with moderate right and mild left foraminal stenosis. - At C6-7 uncovertebral joint and facet hypertrophy with moderate bilateral foraminal stenosis. - At C7-T1 uncovertebral joint and facet hypertrophy with moderate right and mild left foraminal stenosis. - At C4-5 uncovertebral joint hypertrophy with mild right foraminal stenosis. - Note enumeration scheme.  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  Rudimentary C2-3 intervertebral disc space.  Correlate with plain film x-rays if intervention or surgery is considered.

## 2020-04-02 ENCOUNTER — Other Ambulatory Visit: Payer: Self-pay | Admitting: Neurology

## 2020-04-02 MED ORDER — DIAZEPAM 10 MG PO TABS: 10.0000 mg | ORAL_TABLET | Freq: Two times a day (BID) | ORAL | 1 refills | Status: DC

## 2020-04-03 ENCOUNTER — Other Ambulatory Visit: Payer: Self-pay | Admitting: Neurology

## 2020-04-03 DIAGNOSIS — M4802 Spinal stenosis, cervical region: Secondary | ICD-10-CM

## 2020-04-06 ENCOUNTER — Other Ambulatory Visit: Payer: Self-pay | Admitting: Neurology

## 2020-04-06 DIAGNOSIS — M4802 Spinal stenosis, cervical region: Secondary | ICD-10-CM

## 2020-04-06 DIAGNOSIS — G8929 Other chronic pain: Secondary | ICD-10-CM

## 2020-04-06 DIAGNOSIS — M542 Cervicalgia: Secondary | ICD-10-CM

## 2020-04-14 ENCOUNTER — Inpatient Hospital Stay: Admission: RE | Admit: 2020-04-14 | Payer: BC Managed Care – PPO | Source: Ambulatory Visit

## 2020-04-15 ENCOUNTER — Other Ambulatory Visit: Payer: Self-pay

## 2020-04-15 ENCOUNTER — Ambulatory Visit
Admission: RE | Admit: 2020-04-15 | Discharge: 2020-04-15 | Disposition: A | Discharge status: Home / Self Care | Payer: BC Managed Care – PPO | Source: Ambulatory Visit | Attending: Neurology | Admitting: Neurology

## 2020-04-15 DIAGNOSIS — M4802 Spinal stenosis, cervical region: Secondary | ICD-10-CM

## 2020-04-15 DIAGNOSIS — G8929 Other chronic pain: Secondary | ICD-10-CM

## 2020-04-15 DIAGNOSIS — H8101 Meniere's disease, right ear: Secondary | ICD-10-CM | POA: Insufficient documentation

## 2020-04-15 DIAGNOSIS — H903 Sensorineural hearing loss, bilateral: Secondary | ICD-10-CM | POA: Insufficient documentation

## 2020-04-15 DIAGNOSIS — M542 Cervicalgia: Secondary | ICD-10-CM

## 2020-04-15 MED ORDER — IOPAMIDOL (ISOVUE-M 300) INJECTION 61%
1.0000 mL | Freq: Once | INTRAMUSCULAR | Status: AC | PRN
  Administered 2020-04-15: 1 mL via EPIDURAL

## 2020-04-15 MED ORDER — TRIAMCINOLONE ACETONIDE 40 MG/ML IJ SUSP (RADIOLOGY)
60.0000 mg | Freq: Once | INTRAMUSCULAR | Status: AC
  Administered 2020-04-15: 60 mg via EPIDURAL

## 2020-04-15 NOTE — Discharge Instructions (Signed)

## 2020-04-22 ENCOUNTER — Other Ambulatory Visit: Payer: BC Managed Care – PPO

## 2020-05-06 ENCOUNTER — Telehealth: Payer: Self-pay | Admitting: Neurology

## 2020-05-06 DIAGNOSIS — G441 Vascular headache, not elsewhere classified: Secondary | ICD-10-CM

## 2020-05-06 NOTE — Telephone Encounter (Signed)
I called the patient.  The patient is continuing to have headaches and vertigo, he has fallen on a couple of occasions.  He is getting vestibular rehab once a week.  He is not responding to Botox or other medical therapy.  He is on lamotrigine taking 75 mg twice daily, will go up to 100 mg twice daily and then work up by 50 mg every 2 weeks until he gets to 150 mg twice daily.  I will make a referral to [facility], referred to Dr. Thomasena Edis from neurology.

## 2020-05-20 ENCOUNTER — Other Ambulatory Visit: Payer: Self-pay | Admitting: Neurology

## 2020-05-20 MED ORDER — LAMOTRIGINE 100 MG PO TABS: 100.0000 mg | ORAL_TABLET | Freq: Two times a day (BID) | ORAL | 2 refills | Status: DC

## 2020-05-26 ENCOUNTER — Other Ambulatory Visit: Payer: Self-pay | Admitting: Neurology

## 2020-05-27 ENCOUNTER — Telehealth: Payer: Self-pay | Admitting: Neurology

## 2020-05-27 ENCOUNTER — Telehealth: Payer: Self-pay | Admitting: *Deleted

## 2020-05-27 ENCOUNTER — Encounter: Payer: Self-pay | Admitting: Neurology

## 2020-05-27 NOTE — Telephone Encounter (Signed)
Patient states all he needs is a letter to extend his STD for 90 days that states he is having same problems and is unable to get into [facility] 4/7.

## 2020-05-27 NOTE — Telephone Encounter (Signed)
I will write a letter to extend the short-term disability.

## 2020-05-27 NOTE — Telephone Encounter (Signed)
Received fax from [facility] PT for pt (vestibular rehab).  This was done by Marlin Canary, DO in [facility] when pt admitted back in 11/2019.  vanessa to check with therapist about who can sign.  We have seen pt for this problem, but have not ordered.  She will check and let us know. I placed in Dr. Anne Hahn in box to view.

## 2020-05-27 NOTE — Telephone Encounter (Signed)
I have signed the order for vestibular therapy.

## 2020-05-27 NOTE — Telephone Encounter (Signed)
Called patient and informed him that Dr. Anne Hahn has sent a letter recommending to extend his STD until after seen by [facility] in April/2022.  Patient expressed appreciation.

## 2020-05-27 NOTE — Telephone Encounter (Signed)
Pt. is requesting an extension for another 90 days of short term disability. He states he is to go to [facility] on August 27, 2020. He states if he does not answer the phone, to please leave a vm.

## 2020-05-27 NOTE — Telephone Encounter (Signed)
I called the patient today to get his new insurance information and to ask him if this round of Botox helped him. Patient states he doesn't really know and is not sure if he should keep his appointment. Dr. Anne Hahn, should patient keep his appointment or should he cancel? I was not able to confirm if it provides any benefit for him.

## 2020-05-30 ENCOUNTER — Other Ambulatory Visit: Payer: Self-pay | Admitting: Neurology

## 2020-06-01 NOTE — Telephone Encounter (Signed)
Pt called wanting to know what is the status of the letter he was to get with the extended short-term disability for 90 more days. Please advise.

## 2020-06-01 NOTE — Telephone Encounter (Signed)
I called the patient and stated that Dr. Anne Hahn recommends stopping the Botox if he does not get benefit from it. Patient agreed to this and I cancelled his appointment on the 18th.

## 2020-06-02 NOTE — Telephone Encounter (Signed)
Called patient and informed him Dr. Anne Hahn sent his letter on 05/27/20.  He said it isn't in his mychart under letters or anywhere else.  I assured patient I would mail letter for him.    Patient expressed appreciation.

## 2020-06-09 ENCOUNTER — Other Ambulatory Visit: Payer: Self-pay | Admitting: *Deleted

## 2020-06-09 ENCOUNTER — Ambulatory Visit: Payer: BC Managed Care – PPO | Admitting: Neurology

## 2020-06-09 MED ORDER — CYCLOBENZAPRINE HCL 10 MG PO TABS: 10.0000 mg | ORAL_TABLET | Freq: Two times a day (BID) | ORAL | 0 refills | Status: DC

## 2020-06-25 ENCOUNTER — Other Ambulatory Visit: Payer: Self-pay | Admitting: *Deleted

## 2020-06-25 MED ORDER — GABAPENTIN 300 MG PO CAPS: ORAL_CAPSULE | ORAL | 3 refills | Status: DC

## 2020-07-07 ENCOUNTER — Encounter: Payer: Self-pay | Admitting: Neurology

## 2020-07-07 ENCOUNTER — Other Ambulatory Visit: Payer: Self-pay

## 2020-07-07 ENCOUNTER — Ambulatory Visit: Payer: BC Managed Care – PPO | Admitting: Neurology

## 2020-07-07 ENCOUNTER — Other Ambulatory Visit: Payer: Self-pay | Admitting: Neurology

## 2020-07-07 VITALS — BP 162/93 | HR 96 | Ht 65.0 in | Wt 270.4 lb

## 2020-07-07 DIAGNOSIS — H8101 Meniere's disease, right ear: Secondary | ICD-10-CM

## 2020-07-07 DIAGNOSIS — M542 Cervicalgia: Secondary | ICD-10-CM

## 2020-07-07 DIAGNOSIS — G43519 Persistent migraine aura without cerebral infarction, intractable, without status migrainosus: Secondary | ICD-10-CM | POA: Diagnosis not present

## 2020-07-07 DIAGNOSIS — R42 Dizziness and giddiness: Secondary | ICD-10-CM | POA: Diagnosis not present

## 2020-07-07 NOTE — Progress Notes (Signed)
Reason for visit: Headache, vertigo  Francis Butler. is an 45 y.o. male  History of present illness:  Francis Butler is a 45 year old right-handed white male with a history of Mnire's disease, he continues to have ongoing problems with vertigo and nausea and gait instability. 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 The patient has chronic pain in the V2 distribution of the right face and neck pain on the right, and right shoulder pain. The patient has been set up to see Dr. Victorio Palm in April 2022. He is undergoing physical therapy currently, cervical traction seems to help him and they have recommended a personal cervical traction device for home use. The patient is no longer working, he stopped working in October 2021. He feels that he may be unable to return to work. The patient has undergone a cervical epidural injection without significant benefit, the patient is amenable to trying this again. The patient is no longer operating a motor vehicle. He has sleep apnea on CPAP but still has episodes of daytime drowsiness, he may fall asleep during the sentence. The patient returns the office today for further evaluation.  Past Medical History:  Diagnosis Date  . Diabetes mellitus without complication (HCC)   . GERD (gastroesophageal reflux disease)   . Headache   . HTN (hypertension)   . OSA on CPAP     Past Surgical History:  Procedure Laterality Date  . wisdom teeth removal       Family History  Problem Relation Age of Onset  . Hypertension Father     Social history:  reports that he has never smoked. He has never used smokeless tobacco. He reports that he does not drink alcohol and does not use drugs.    Allergies  Allergen Reactions  . Bee Pollen Swelling  . Penicillins Hives  . Thorazine [Chlorpromazine] Other (See Comments)    "I was unable to wake up after taking the medication. My parents tried to wake me several times.   They reported I lost color and was extremely lethargic"  . Scopolamine Other (See Comments)    Pupils to dilate    Medications:  Prior to Admission medications   Medication Sig Start Date End Date Taking? Authorizing Provider  albuterol (VENTOLIN HFA) 108 (90 Base) MCG/ACT inhaler Inhale into the lungs every 6 (six) hours as needed for wheezing or shortness of breath.   Yes [provider]  cyclobenzaprine (FLEXERIL) 10 MG tablet Take 1 tablet (10 mg total) by mouth in the morning and at bedtime. 06/09/20  Yes York Spaniel, MD  diazepam (VALIUM) 10 MG tablet Take 1 tablet (10 mg total) by mouth in the morning and at bedtime. 04/02/20  Yes York Spaniel, MD  gabapentin (NEURONTIN) 300 MG capsule 1 CAPSULE IN THE MORNING AND 2 CAPSULES IN THE EVENING 06/25/20  Yes York Spaniel, MD  lamoTRIgine (LAMICTAL) 100 MG tablet Take 1 tablet (100 mg total) by mouth 2 (two) times daily. 05/20/20  Yes York Spaniel, MD  lisinopril (ZESTRIL) 10 MG tablet Take 1 tablet (10 mg total) by mouth daily. 11/28/19  Yes Vann, Jessica U, DO  MAGNESIUM PO Take 500 mg by mouth.   Yes [provider]  MELATONIN PO Take 5 mg by mouth at bedtime.    Yes [provider]  montelukast (SINGULAIR) 10 MG tablet Take 10 mg by mouth at bedtime.   Yes [provider]  omeprazole (PRILOSEC) 20 MG capsule Take 20 mg by mouth daily.   Yes [provider]  ondansetron (ZOFRAN-ODT) 8 MG disintegrating tablet Take 1 tablet (8 mg total) by mouth every 8 (eight) hours as needed for nausea or vomiting. 11/21/19  Yes York Spaniel, MD  Riboflavin (B2 PO) Take 100 mg by mouth in the morning and at bedtime.   Yes [provider]  rizatriptan (MAXALT) 10 MG tablet Take 1 tablet (10 mg total) by mouth 3 (three) times daily as needed (For dizziness). 11/21/19  Yes York Spaniel, MD  verapamil (CALAN-SR) 180 MG CR tablet Take 180 mg by mouth every evening.   Yes [provider]  azithromycin (ZITHROMAX) 500 MG tablet Take 1 tablet (500 mg total) by mouth daily. 11/24/19   Joseph Art, DO    ROS:  Out of a complete 14 system review of symptoms, the patient complains only of the following symptoms, and all other reviewed systems are negative.  Headache Neck pain, right shoulder pain Vertigo Nausea Gait disturbance  Blood pressure (!) 162/93, pulse 96, height 5\' 5"  (1.651 m), weight 270 lb 6.4 oz (122.7 kg).  Physical Exam  General: The patient is alert and cooperative at the time of the examination. The patient is markedly obese.  Skin: No significant peripheral edema is noted.   Neurologic Exam  Mental status: The patient is alert and oriented x 3 at the time of the examination. The patient has apparent normal recent and remote memory, with an apparently normal attention span and concentration ability.   Cranial nerves: Facial symmetry is present. Speech is normal, no aphasia or dysarthria is noted. Extraocular movements are full. Visual fields are full. No nystagmus is seen. Some decrease in soft touch sensation on the face is noted on the right.  Motor: The patient has good strength in all 4 extremities.  Sensory examination: Soft touch sensation is symmetric on the arms and legs.  Coordination: The patient has good finger-nose-finger and heel-to-shin bilaterally.  Gait and station: The patient has a normal gait. Tandem gait is unsteady. Romberg is positive, the patient falls backwards  Reflexes: Deep tendon reflexes are symmetric.   Assessment/Plan:  1. Chronic neck and right shoulder pain  2. Right V2 distribution facial pain  3. Mnire's disease, chronic vertigo, nausea, vomiting  4. Gait disturbance  The patient has not responded to multiple medical therapies and to physical therapy. He has failed Botox therapy. He is no longer working. He will be sent for another cervical epidural injection, if this is not helpful,  we will not continue to order this. I look forward to the second opinion through Dr. . The patient will follow up here in 6 months.  Thomasena Edis MD 07/07/2020 10:05 AM  [facility] [address] [place], [place]  Phone [phone] Fax [phone]

## 2020-07-09 ENCOUNTER — Telehealth: Payer: Self-pay | Admitting: Neurology

## 2020-07-09 MED ORDER — CYCLOBENZAPRINE HCL 10 MG PO TABS: 10.0000 mg | ORAL_TABLET | Freq: Two times a day (BID) | ORAL | 2 refills | Status: DC

## 2020-07-09 NOTE — Telephone Encounter (Signed)
A refill for cyclobenzaprine was sent in.

## 2020-07-14 ENCOUNTER — Other Ambulatory Visit: Payer: Self-pay

## 2020-07-14 ENCOUNTER — Ambulatory Visit
Admission: RE | Admit: 2020-07-14 | Discharge: 2020-07-14 | Disposition: A | Discharge status: Home / Self Care | Payer: BC Managed Care – PPO | Source: Ambulatory Visit | Attending: Neurology | Admitting: Neurology

## 2020-07-14 DIAGNOSIS — M542 Cervicalgia: Secondary | ICD-10-CM

## 2020-07-14 MED ORDER — IOPAMIDOL (ISOVUE-M 300) INJECTION 61%
1.0000 mL | Freq: Once | INTRAMUSCULAR | Status: AC | PRN
  Administered 2020-07-14: 1 mL via EPIDURAL

## 2020-07-14 MED ORDER — TRIAMCINOLONE ACETONIDE 40 MG/ML IJ SUSP (RADIOLOGY)
60.0000 mg | Freq: Once | INTRAMUSCULAR | Status: AC
  Administered 2020-07-14: 60 mg via EPIDURAL

## 2020-07-14 NOTE — Discharge Instructions (Signed)

## 2020-07-20 ENCOUNTER — Telehealth: Payer: Self-pay | Admitting: Neurology

## 2020-07-20 MED ORDER — DEXAMETHASONE 2 MG PO TABS: ORAL_TABLET | ORAL | 0 refills | Status: DC

## 2020-07-20 NOTE — Telephone Encounter (Signed)
Patient's mom called and stated that Francis Butler told her that he feels like somebody has hit him with a baseball bat on the right side from ear to shoulder.    He has taken zofran, valium and maxalt @ 1510.  Advised he can take the Maxalt again in 2 hours and then again in 2 hours.  Stated the pain was instantaneous for onset.  Does not know what to ask for to help with the pain nor used anything else to help with migraines.Is looking for advice on anything else he can take or do to get rid of the headache.

## 2020-07-20 NOTE — Addendum Note (Signed)
Addended by: York Spaniel on: 07/20/2020 05:17 PM   Modules accepted: Orders

## 2020-07-20 NOTE — Telephone Encounter (Signed)
I called the patient.  He began having a rocking sensation that began about 2 PM today.  He has had some increase in the right face and jaw pain and neck and shoulder discomfort with this.  He has nausea, he took diazepam and Zofran and Maxalt.  He is feeling slightly better, still having a lot of problem, will send in a 3-day course of Decadron.

## 2020-07-20 NOTE — Telephone Encounter (Signed)
Pt's mother(on DPR) has called to report pt is having a bad attack, she is asking for a call to discuss

## 2020-08-10 ENCOUNTER — Other Ambulatory Visit: Payer: Self-pay | Admitting: Emergency Medicine

## 2020-08-10 MED ORDER — RIZATRIPTAN BENZOATE 10 MG PO TABS: 10.0000 mg | ORAL_TABLET | Freq: Three times a day (TID) | ORAL | 5 refills | Status: DC | PRN

## 2020-08-10 MED ORDER — ONDANSETRON 8 MG PO TBDP: 8.0000 mg | ORAL_TABLET | Freq: Three times a day (TID) | ORAL | 3 refills | Status: DC | PRN

## 2020-08-13 ENCOUNTER — Other Ambulatory Visit: Payer: Self-pay | Admitting: Neurology

## 2020-08-17 ENCOUNTER — Encounter: Payer: Self-pay | Admitting: Neurology

## 2020-08-18 ENCOUNTER — Telehealth: Payer: Self-pay | Admitting: *Deleted

## 2020-08-18 NOTE — Telephone Encounter (Signed)
Short term disability forms completed by Dr Anne Hahn. Sent to medical records for processing.

## 2020-08-19 ENCOUNTER — Telehealth: Payer: Self-pay | Admitting: *Deleted

## 2020-08-19 NOTE — Telephone Encounter (Signed)
Pt letter and Short term form @ front desk for p/u

## 2020-08-20 ENCOUNTER — Other Ambulatory Visit: Payer: Self-pay | Admitting: Neurology

## 2020-09-11 ENCOUNTER — Other Ambulatory Visit: Payer: Self-pay | Admitting: Neurology

## 2020-09-11 MED ORDER — PREGABALIN 100 MG PO CAPS: ORAL_CAPSULE | ORAL | 2 refills | Status: DC

## 2020-10-20 ENCOUNTER — Other Ambulatory Visit: Payer: Self-pay | Admitting: Emergency Medicine

## 2020-10-20 MED ORDER — CYCLOBENZAPRINE HCL 10 MG PO TABS: 10.0000 mg | ORAL_TABLET | Freq: Two times a day (BID) | ORAL | 2 refills | Status: DC

## 2020-11-11 ENCOUNTER — Telehealth: Payer: Self-pay | Admitting: *Deleted

## 2020-11-11 ENCOUNTER — Encounter: Payer: Self-pay | Admitting: Neurology

## 2020-11-11 NOTE — Telephone Encounter (Signed)
R/c form from [employer]

## 2020-11-12 ENCOUNTER — Encounter: Payer: Self-pay | Admitting: Neurology

## 2020-11-12 NOTE — Telephone Encounter (Signed)
See My Chart Message.

## 2020-11-18 ENCOUNTER — Telehealth: Payer: Self-pay | Admitting: *Deleted

## 2020-11-18 MED ORDER — LAMOTRIGINE 100 MG PO TABS: ORAL_TABLET | ORAL | 1 refills | Status: DC

## 2020-11-18 MED ORDER — DIAZEPAM 10 MG PO TABS: 10.0000 mg | ORAL_TABLET | Freq: Two times a day (BID) | ORAL | 1 refills | Status: DC

## 2020-11-18 NOTE — Telephone Encounter (Signed)
Pt letter faxed to [employer] on 11/18/20

## 2020-12-11 ENCOUNTER — Ambulatory Visit
Admission: EM | Admit: 2020-12-11 | Discharge: 2020-12-11 | Disposition: A | Discharge status: Home / Self Care | Payer: BC Managed Care – PPO | Attending: Emergency Medicine | Admitting: Emergency Medicine

## 2020-12-11 ENCOUNTER — Encounter: Payer: Self-pay | Admitting: Emergency Medicine

## 2020-12-11 ENCOUNTER — Ambulatory Visit (INDEPENDENT_AMBULATORY_CARE_PROVIDER_SITE_OTHER): Payer: BC Managed Care – PPO

## 2020-12-11 DIAGNOSIS — R062 Wheezing: Secondary | ICD-10-CM

## 2020-12-11 DIAGNOSIS — R0781 Pleurodynia: Secondary | ICD-10-CM | POA: Diagnosis not present

## 2020-12-11 DIAGNOSIS — R079 Chest pain, unspecified: Secondary | ICD-10-CM | POA: Diagnosis not present

## 2020-12-11 DIAGNOSIS — W19XXXA Unspecified fall, initial encounter: Secondary | ICD-10-CM | POA: Diagnosis not present

## 2020-12-11 MED ORDER — DEXAMETHASONE SODIUM PHOSPHATE 10 MG/ML IJ SOLN
10.0000 mg | Freq: Once | INTRAMUSCULAR | Status: AC
  Administered 2020-12-11: 10 mg via INTRAMUSCULAR

## 2020-12-11 NOTE — ED Triage Notes (Signed)
Ell on Thursday morning.  Pain on left side.  Hurts to cough or sneeze.  Would like x-ray of ribs.

## 2020-12-11 NOTE — Discharge Instructions (Addendum)
X-rays negative for fracture or dislocation Steroid shot given in office for wheezing and pain Continue conservative management of rest, ice, and gentle stretches Continue with OTC medications as needed Follow up with PCP next week for recheck Return or go to the ER if you have any new or worsening symptoms (fever, chills, chest pain, shortness of breath, trouble breathing, bruising, redness, etc...)

## 2020-12-11 NOTE — ED Provider Notes (Signed)
[facility]   [id number] 12/11/20 Arrival Time: 1629  CC: Rib pain  SUBJECTIVE: History from: patient. Francis Butler. is a 45 y.o. male complains of LT sided rib pain x 1 day.  Fall forward.  Localizes the pain to the LT lower ribs.  Describes the pain as intermittent and achy in character.  Has tried OTC medications without relief.  Symptoms are made worse with deep breath.  Reports similar symptoms in the past.  Denies fever, chills, erythema, ecchymosis, effusion, weakness, CP, SOB.    Has been dealing with asthma flare and reports persistent wheezing.  Has tried two rounds or steroids and inhaler with temporary relief.    ROS: As per HPI.  All other pertinent ROS negative.     Past Medical History:  Diagnosis Date   Diabetes mellitus without complication (HCC)    GERD (gastroesophageal reflux disease)    Headache    HTN (hypertension)    OSA on CPAP    Past Surgical History:  Procedure Laterality Date   wisdom teeth removal      Allergies  Allergen Reactions   Bee Pollen Swelling   Penicillins Hives   Thorazine [Chlorpromazine] Other (See Comments)    "I was unable to wake up after taking the medication. My parents tried to wake me several times.  They reported I lost color and was extremely lethargic"   Scopolamine Other (See Comments)    Pupils to dilate   No current facility-administered medications on file prior to encounter.   Current Outpatient Medications on File Prior to Encounter  Medication Sig Dispense Refill   albuterol (VENTOLIN HFA) 108 (90 Base) MCG/ACT inhaler Inhale into the lungs every 6 (six) hours as needed for wheezing or shortness of breath.     cyclobenzaprine (FLEXERIL) 10 MG tablet Take 1 tablet (10 mg total) by mouth in the morning and at bedtime. 180 tablet 2   diazepam (VALIUM) 10 MG tablet Take 1 tablet (10 mg total) by mouth in the morning and at bedtime. 180 tablet 1   lamoTRIgine (LAMICTAL) 100 MG tablet TAKE 1 TABLET(100 MG)  BY MOUTH TWICE DAILY 180 tablet 1   lisinopril (ZESTRIL) 10 MG tablet Take 1 tablet (10 mg total) by mouth daily.     MAGNESIUM PO Take 500 mg by mouth.     MELATONIN PO Take 5 mg by mouth at bedtime.      montelukast (SINGULAIR) 10 MG tablet Take 10 mg by mouth at bedtime.     omeprazole (PRILOSEC) 20 MG capsule Take 20 mg by mouth daily.     ondansetron (ZOFRAN-ODT) 8 MG disintegrating tablet Take 1 tablet (8 mg total) by mouth every 8 (eight) hours as needed for nausea or vomiting. 30 tablet 3   pregabalin (LYRICA) 100 MG capsule 1 capsule twice daily for 1 week, then take 1 capsule 3 times daily 90 capsule 2   Riboflavin (B2 PO) Take 100 mg by mouth in the morning and at bedtime.     rizatriptan (MAXALT) 10 MG tablet Take 1 tablet (10 mg total) by mouth 3 (three) times daily as needed (For dizziness). 10 tablet 5   verapamil (CALAN-SR) 180 MG CR tablet Take 180 mg by mouth every evening.     Social History   Socioeconomic History   Marital status: Legally Separated    Spouse name: Not on file   Number of children: 2   Years of education: Not on file   Highest education  level: Not on file  Occupational History   Occupation: unemployed 07/07/20  Tobacco Use   Smoking status: Never   Smokeless tobacco: Never  Substance and Sexual Activity   Alcohol use: No    Alcohol/week: 0.0 standard drinks   Drug use: No   Sexual activity: Not on file  Other Topics Concern   Not on file  Social History Narrative   Lives alone   Right handed   Drinks 3-4 cups of caffeine daily   Social Determinants of Health   Financial Resource Strain: Not on file  Food Insecurity: Not on file  Transportation Needs: Not on file  Physical Activity: Not on file  Stress: Not on file  Social Connections: Not on file  Intimate Partner Violence: Not on file   Family History  Problem Relation Age of Onset   Hypertension Father     OBJECTIVE:  Vitals:   12/11/20 1706  BP: (!) 154/106  Pulse: (!)  112  Resp: 16  Temp: 98.4 F (36.9 C)  TempSrc: Oral  SpO2: 94%    General appearance: ALERT; in no acute distress.  Head: NCAT Lungs: Normal respiratory effort; wheezing throughout bilateral lung fields CV: RRR Musculoskeletal: Chest wall Inspection: Skin warm, dry, clear and intact without obvious erythema, effusion, or ecchymosis.  Palpation: TTP over lateral distal ribs on LT side Skin: warm and dry Neurologic: Ambulates without difficulty; Sensation intact about the upper/ lower extremities Psychological: alert and cooperative; normal mood and affect  DIAGNOSTIC STUDIES:  DG Ribs Unilateral W/Chest Left  Result Date: 12/11/2020 CLINICAL DATA:  45 year old male with fall and left chest wall pain. EXAM: LEFT RIBS AND CHEST - 3+ VIEW COMPARISON:  Chest radiograph dated 11/21/2019 FINDINGS: No focal consolidation, pleural effusion, or pneumothorax. The cardiac silhouette is within limits. Old healed fractures of the left seventh and eighth ribs. No acute fracture. IMPRESSION: 1. No acute cardiopulmonary process. 2. No acute fracture. Electronically Signed   By: Elgie Collard M.D.   On: 12/11/2020 17:41      I have reviewed the x-rays myself and the radiologist interpretation. I am in agreement with the radiologist interpretation.     ASSESSMENT & PLAN:  1. Rib pain on left side   2. Wheezing    Meds ordered this encounter  Medications   dexamethasone (DECADRON) injection 10 mg   X-rays negative for fracture or dislocation Steroid shot given in office for wheezing and pain Continue conservative management of rest, ice, and gentle stretches Continue with OTC medications as needed Follow up with PCP next week for recheck Return or go to the ER if you have any new or worsening symptoms (fever, chills, chest pain, shortness of breath, trouble breathing, bruising, redness, etc...)    Reviewed expectations re: course of current medical issues. Questions answered. Outlined  signs and symptoms indicating need for more acute intervention. Patient verbalized understanding. After Visit Summary given.     Rennis Harding, PA-C 12/11/20 1750

## 2020-12-17 ENCOUNTER — Other Ambulatory Visit: Payer: Self-pay | Admitting: Neurology

## 2020-12-17 MED ORDER — PREGABALIN 100 MG PO CAPS: 100.0000 mg | ORAL_CAPSULE | Freq: Three times a day (TID) | ORAL | 1 refills | Status: DC

## 2021-01-04 ENCOUNTER — Telehealth: Payer: Self-pay

## 2021-01-04 ENCOUNTER — Ambulatory Visit: Payer: BC Managed Care – PPO | Admitting: Neurology

## 2021-01-04 NOTE — Telephone Encounter (Signed)
Forms received, completed and placed on dr.willis desk for review and signature.

## 2021-01-07 NOTE — Telephone Encounter (Signed)
Form completed and signed, given to Stanton Kidney in medical records for pt pick up.

## 2021-01-07 NOTE — Telephone Encounter (Signed)
Pt short-term form ready for p/u @ the front desk.

## 2021-01-11 ENCOUNTER — Encounter: Payer: Self-pay | Admitting: Neurology

## 2021-01-11 ENCOUNTER — Other Ambulatory Visit: Payer: Self-pay

## 2021-01-11 ENCOUNTER — Ambulatory Visit: Payer: BC Managed Care – PPO | Admitting: Neurology

## 2021-01-11 VITALS — BP 156/104 | HR 103 | Ht 65.0 in | Wt 282.8 lb

## 2021-01-11 DIAGNOSIS — G43519 Persistent migraine aura without cerebral infarction, intractable, without status migrainosus: Secondary | ICD-10-CM

## 2021-01-11 DIAGNOSIS — R42 Dizziness and giddiness: Secondary | ICD-10-CM | POA: Diagnosis not present

## 2021-01-11 MED ORDER — TIAGABINE HCL 4 MG PO TABS: ORAL_TABLET | ORAL | 3 refills | Status: DC

## 2021-01-11 MED ORDER — DIAZEPAM 5 MG PO TABS: 5.0000 mg | ORAL_TABLET | Freq: Two times a day (BID) | ORAL | 3 refills | Status: DC | PRN

## 2021-01-11 NOTE — Patient Instructions (Signed)
With the Lyrica (pregabalin) 100 mg capsule, take one twice a day for 2 weeks, then take one a day for 2 weeks, then stop.  We will start gabatril for the facial pain.  Consider a low carbohydrate diet such as Optivia for weight loss.

## 2021-01-11 NOTE — Progress Notes (Signed)
Reason for visit: Facial pain, neck and shoulder pain on the right, vertigo  Francis Butler. is an 45 y.o. male  History of present illness:  Francis Butler is a 45 year old right-handed white male with history of obesity and sleep apnea with significant excessive daytime drowsiness.  He has had significant issues with vertigo and right facial pain and right neck and shoulder pain since 2016.  He has been seen by ENT, he has been seen through this office and through Dr. Thomasena Edis at [facility]  He will be seen through [facility] on 01 February 2021 for another opinion regarding his symptoms.  The patient has been on Lyrica and Lamictal without much benefit.  He has been tried on Botox injections without benefit.  He reports that he has to use a heating pad on the shoulder and on the right face to reduce some of the pain, the vertigo remains a big issue for him.  He will fall on occasion, the last fall was 5 to 6 weeks ago.  He may have some nausea with the vertigo.  Weather changes seem to worsen his symptoms.  He has had weight gain associated with inactivity, his last hemoglobin A1c was 6.3.  He has had recent blood work done through his primary care physician.  He is now on metformin.  The patient reports severe motion sickness, he does not drive a car.  He returns to this office for further evaluation.  He is having episodes of slurring of speech at times.  He takes diazepam 10 mg tablets if needed, he does not take this on a regular basis.  This seems to help the dizziness some.  Past Medical History:  Diagnosis Date   Diabetes mellitus without complication (HCC)    GERD (gastroesophageal reflux disease)    Headache    HTN (hypertension)    OSA on CPAP     Past Surgical History:  Procedure Laterality Date   wisdom teeth removal       Family History  Problem Relation Age of Onset   Hypertension Father     Social history:  reports that he has never smoked. He has never used smokeless tobacco.  He reports that he does not drink alcohol and does not use drugs.    Allergies  Allergen Reactions   Bee Pollen Swelling   Penicillins Hives   Thorazine [Chlorpromazine] Other (See Comments)    "I was unable to wake up after taking the medication. My parents tried to wake me several times.  They reported I lost color and was extremely lethargic"   Scopolamine Other (See Comments)    Pupils to dilate    Medications:  Prior to Admission medications   Medication Sig Start Date End Date Taking? Authorizing Provider  albuterol (VENTOLIN HFA) 108 (90 Base) MCG/ACT inhaler Inhale into the lungs every 6 (six) hours as needed for wheezing or shortness of breath.   Yes [provider]  cyclobenzaprine (FLEXERIL) 10 MG tablet Take 1 tablet (10 mg total) by mouth in the morning and at bedtime. 10/20/20  Yes York Spaniel, MD  diazepam (VALIUM) 10 MG tablet Take 1 tablet (10 mg total) by mouth in the morning and at bedtime. 11/18/20  Yes York Spaniel, MD  lamoTRIgine (LAMICTAL) 100 MG tablet TAKE 1 TABLET(100 MG) BY MOUTH TWICE DAILY 11/18/20  Yes York Spaniel, MD  lisinopril (ZESTRIL) 20 MG tablet Take 20 mg by mouth daily. 01/07/21  Yes [provider]  MAGNESIUM PO Take 500 mg by mouth.   Yes [provider]  MELATONIN PO Take 5 mg by mouth at bedtime.    Yes [provider]  metFORMIN (GLUCOPHAGE) 500 MG tablet Take 500 mg by mouth 2 (two) times daily. 01/07/21  Yes [provider]  montelukast (SINGULAIR) 10 MG tablet Take 10 mg by mouth at bedtime.   Yes [provider]  omeprazole (PRILOSEC) 20 MG capsule Take 20 mg by mouth daily.   Yes [provider]  ondansetron (ZOFRAN-ODT) 8 MG disintegrating tablet Take 1 tablet (8 mg total) by mouth every 8 (eight) hours as needed for nausea or vomiting. 08/10/20  Yes York Spaniel, MD  pregabalin (LYRICA) 100 MG capsule Take 1 capsule (100 mg total) by mouth 3 (three) times  daily. 12/17/20  Yes York Spaniel, MD  Riboflavin (B2 PO) Take 100 mg by mouth in the morning and at bedtime.   Yes [provider]  rizatriptan (MAXALT) 10 MG tablet Take 1 tablet (10 mg total) by mouth 3 (three) times daily as needed (For dizziness). 08/10/20  Yes York Spaniel, MD  verapamil (CALAN-SR) 180 MG CR tablet Take 180 mg by mouth every evening.   Yes [provider]    ROS:  Out of a complete 14 system review of symptoms, the patient complains only of the following symptoms, and all other reviewed systems are negative.  Daytime drowsiness Face pain, shoulder discomfort Vertigo nausea  Blood pressure (!) 156/104, pulse (!) 103, height 5\' 5"  (1.651 m), weight 282 lb 12.8 oz (128.3 kg).  Physical Exam  General: The patient is alert and cooperative at the time of the examination.  The patient is markedly obese.  Skin: 2+ edema below the knees is seen bilaterally.   Neurologic Exam  Mental status: The patient is alert and oriented x 3 at the time of the examination. The patient has apparent normal recent and remote memory, with an apparently normal attention span and concentration ability.   Cranial nerves: Facial symmetry is present. Speech is normal, no aphasia or dysarthria is noted. Extraocular movements are full. Visual fields are full.  Motor: The patient has good strength in all 4 extremities.  Sensory examination: Soft touch sensation is symmetric on the face, arms, and legs.  Light touch on the right face is more sensitive than not on the left.  Coordination: The patient has good finger-nose-finger and heel-to-shin bilaterally.  Gait and station: The patient has a normal gait. Tandem gait is unsteady.  Romberg is positive, the patient falls backwards.  Reflexes: Deep tendon reflexes are symmetric.   Assessment/Plan:  1.  Vertigo  2.  Right face pain, right shoulder discomfort  3.  Sleep apnea on CPAP, excessive daytime  drowsiness  The patient is doing poorly with his underlying symptoms.  He claims that the lamotrigine and Lyrica have not offered any benefit.  We will initiate a taper off of the Lyrica by 100 mg every 2 weeks until he stops the medication.  We may take him off of the lamotrigine as well.  I will give him a trial on Gabitril for the facial pain starting at 4 mg daily for 2 weeks and go to 8 mg daily.  The patient will follow-up in 4 months, he will be seen by Dr. in the future.  I will reduce the diazepam dose to 5 mg.  Delena Bali MD 01/11/2021 10:44 AM  [facility] [address] [place], [place] [address]  Phone [phone] Fax [phone]

## 2021-03-15 ENCOUNTER — Other Ambulatory Visit: Payer: Self-pay | Admitting: Neurology

## 2021-03-15 MED ORDER — NURTEC 75 MG PO TBDP: 75.0000 mg | ORAL_TABLET | Freq: Every day | ORAL | 3 refills | Status: DC | PRN

## 2021-03-15 MED ORDER — ONDANSETRON 8 MG PO TBDP: 8.0000 mg | ORAL_TABLET | Freq: Three times a day (TID) | ORAL | 3 refills | Status: DC | PRN

## 2021-03-15 MED ORDER — RIZATRIPTAN BENZOATE 10 MG PO TABS: 10.0000 mg | ORAL_TABLET | Freq: Three times a day (TID) | ORAL | 5 refills | Status: DC | PRN

## 2021-03-15 NOTE — Progress Notes (Signed)
Nurtec prescription was sent in

## 2021-04-07 ENCOUNTER — Other Ambulatory Visit: Payer: Self-pay | Admitting: Neurology

## 2021-04-08 ENCOUNTER — Encounter: Payer: Self-pay | Admitting: Psychiatry

## 2021-04-08 ENCOUNTER — Ambulatory Visit: Payer: BC Managed Care – PPO | Admitting: Psychiatry

## 2021-04-08 ENCOUNTER — Other Ambulatory Visit: Payer: Self-pay

## 2021-04-08 VITALS — BP 164/102 | HR 92 | Ht 65.0 in | Wt 266.0 lb

## 2021-04-08 DIAGNOSIS — R42 Dizziness and giddiness: Secondary | ICD-10-CM | POA: Diagnosis not present

## 2021-04-08 DIAGNOSIS — R21 Rash and other nonspecific skin eruption: Secondary | ICD-10-CM

## 2021-04-08 MED ORDER — ONDANSETRON 8 MG PO TBDP: 8.0000 mg | ORAL_TABLET | Freq: Three times a day (TID) | ORAL | 3 refills | Status: DC | PRN

## 2021-04-08 NOTE — Progress Notes (Addendum)
   CC:  headaches  Follow-up Visit  Last visit: 01/11/21  Brief HPI:  45 year old male with a history of asthma, DM, HTN, ?meniere's disease, OSA who previously followed with Dr. Anne Hahn for vertigo, headaches, and neck pain.  Interval History: Initially vertigo symptoms were intermittent, now he is constantly dizzy and feels like he is "on a boat". He has a constant dull pain in his right ear and constant redness on his right cheek. Reports visual distortion associated with his vertigo where objects will look like they are moving. Denies throbbing headaches, photophobia, or phonophobia. Facial pain is described as a dull ache.  Maxalt helped him sleep but otherwise didn't help his symptoms. Tiagabine helped his pain somewhat. Heating pads help with his shoulder and neck pain.  Recently saw Neurology at [facility] in September who did not believe he had symptoms consistent with a primary headache disorder and referred him to ENT. Was seen by ENT at [facility] 03/17/21. 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 ENT did not believe symptoms were fully explained by Meniere's disease. He is planned to get updated vestibular testing.  MRI brain was done last week which was normal.   Current Headache Regimen: Preventative: tiagabine Abortive: Zofran  Prior Therapies                                  Lyrica Lamictal Gabapentin Verapamil nortriptyline Topamax metoprolol Indomethacin Ajovy Botox HCTZ Flexeril Zofran diazepam Meclizine  Physical Exam:   Vital Signs: BP (!) 164/102   Pulse 92   Ht 5\' 5"  (1.651 m)   Wt 266 lb (120.7 kg)   BMI 44.26 kg/m  GENERAL:  well appearing, in no acute distress, alert  SKIN:  +red erythematous patch over right V2 HEAD:  Normocephalic/atraumatic. RESP: normal respiratory effort MSK:  No gross joint deformities.   NEUROLOGICAL: Mental Status: Alert, oriented to person, place and time, Follows commands, and Speech fluent and  appropriate. Cranial Nerves: PERRL, face symmetric, +saccades +nystagmus Motor: moves all extremities equally Gait: normal-based.  IMPRESSION: 45 year old male with a history of asthma, DM, HTN, ?meniere's disease, OSA who presents for follow up of vertigo and ear/neck pain. He does not report migrainous features and has not responded to migraine medications. Recent MRI brain was normal and did not reveal a neurologic cause for his symptoms. Furthermore, it is unusual for a facial redness to be associated with primary neurologic disorder. He did not respond to indomethacin which makes unusual presentation of a TAC unlikely. Red ear syndrome can present with skin flushing though this is typically paroxysmal and occurs over the ear rather than the cheek. He denies ear redness. He does have a history of right mastoidectomy, it is possible he may have some reflex sympathetic dysfunction causing his skin changes. As of now I do not have a good explanation for his persistent vertigo other than his ?Meniere's disease. Will await vestibular testing and order autoimmune blood work today.  PLAN: -Autoimmune labs -consider dermatology referral for facial rash if it remains persistent -Follow up after testing -next steps: consider retrial of gabapentin at higher dose, SNRI, auricular/occipital nerve blocks, lidocaine or capsaicin cream, referral to pain management   Follow-up: 04/21/21  I spent a total of 59 minutes on the date of the service chart reviewing and face to face with the patient.  04/23/21, MD

## 2021-04-12 ENCOUNTER — Telehealth: Payer: Self-pay

## 2021-04-12 NOTE — Telephone Encounter (Signed)
Paper work for disability eligibility review has been completed and signed by Dr. Delena Bali. Paper work has been sent back to medical records for processing.

## 2021-04-13 ENCOUNTER — Encounter: Payer: Self-pay | Admitting: Psychiatry

## 2021-04-13 ENCOUNTER — Telehealth: Payer: Self-pay | Admitting: *Deleted

## 2021-04-13 LAB — ANA+ENA+DNA/DS+SCL 70+SJOSSA/B
ANA Titer 1: NEGATIVE
ENA RNP Ab: <0.2 AI (ref 0.0–0.9)
ENA SM Ab Ser-aCnc: <0.2 AI (ref 0.0–0.9)
ENA SSA (RO) Ab: <0.2 AI (ref 0.0–0.9)
ENA SSB (LA) Ab: <0.2 AI (ref 0.0–0.9)
Scleroderma (Scl-70) (ENA) Antibody, IgG: <0.2 AI (ref 0.0–0.9)
dsDNA Ab: 1 IU/mL (ref 0–9)

## 2021-04-13 NOTE — Telephone Encounter (Signed)
Pt short term form ready for p/u form @ the front desk.

## 2021-04-19 ENCOUNTER — Encounter: Payer: Self-pay | Admitting: Psychiatry

## 2021-04-19 NOTE — Telephone Encounter (Signed)
See my chart message on this from 04/19/2021.

## 2021-04-19 NOTE — Telephone Encounter (Signed)
That's fine, we can extend it to 3 months. Thanks!

## 2021-04-19 NOTE — Telephone Encounter (Signed)
Pt called, the date is incorrect on form. Want to if form can be corrected and picked up on Wednesday or if copy can be uploaded to MyChart? Would like a call from the nurse.

## 2021-04-21 ENCOUNTER — Encounter: Payer: Self-pay | Admitting: Psychiatry

## 2021-04-21 ENCOUNTER — Ambulatory Visit: Payer: BC Managed Care – PPO | Admitting: Psychiatry

## 2021-04-21 VITALS — BP 152/94 | HR 111 | Ht 65.0 in | Wt 268.0 lb

## 2021-04-21 DIAGNOSIS — R42 Dizziness and giddiness: Secondary | ICD-10-CM | POA: Diagnosis not present

## 2021-04-21 DIAGNOSIS — R21 Rash and other nonspecific skin eruption: Secondary | ICD-10-CM

## 2021-04-21 DIAGNOSIS — H53149 Visual discomfort, unspecified: Secondary | ICD-10-CM

## 2021-04-21 NOTE — Progress Notes (Signed)
CC:  headaches  Follow-up Visit  Last visit: 04/08/21  Brief HPI: 45 year old male with a history of asthma, DM, HTN, ?meniere's disease, OSA who follows in clinic for vertigo, ear pain, and neck pain. Symptoms initially began as head congestion in 2016, then felt a pop in his ear and developed severe vertigo and diaphoresis. Initially this was intermittent but has gotten more frequent over time. Also developed pain around his ear going down his neck and shoulder. Hasn't had more than a few asymptomatic days since this started. He was diagnosed with Meniere's disease. Underwent injections in his eardrum which gave some relief. Then underwent mastoidectomy a few months later. Developed flushing on the right side of his face around the time he had his mastoidectomy. Previously tried a mouth guard for TMJ but this didn't help.  Condition significantly worsened in March 2021 following a fall where he hit his right side. This worsened the pain in his right neck and shoulder.  Interval History: He is doing dry needling and PT weekly for his neck and shoulder pain. This provides temporary relief but pain returns.  Vertigo is present most days. May get 3-4 days per month where he does not have vertigo. Vertigo is worse when he tilts his head to the side. Otherwise it is constant. Is undergoing vestibular therapy.  Redness in the face has been constant since it began in March 2016.  Takes rizatriptan to help him sleep 2-3 times per week. Takes zofran for nausea which helps.  Has trouble falling asleep during the day. He does sleep with a CPAP.  Works as a Sports coach for [employer]. He spends most of his day looking at computer screens. Looking at screens and zoom meetings in particular can trigger blurred vision and vertigo. Has difficulty focusing vision his vision. Eye exam was normal. Sitting for prolonged periods of time can trigger pain and vertigo. Has not noticed much difference in his  symptoms since he has stopped work.  He is not currently driving.  Autoimmune labs were negative.   Prior Therapies                                  Lyrica Lamictal Gabapentin Verapamil nortriptyline Topamax metoprolol Indomethacin Ajovy Botox HCTZ Flexeril Zofran diazepam Meclizine  Physical Exam:   Vital Signs: BP (!) 152/94   Pulse (!) 111   Ht 5\' 5"  (1.651 m)   Wt 268 lb (121.6 kg)   SpO2 96%   BMI 44.60 kg/m  GENERAL:  well appearing, in no acute distress, alert  SKIN:  skin is red and erythematous over right V2-V3 HEAD:  Normocephalic/atraumatic. RESP: normal respiratory effort  NEUROLOGICAL: Mental Status: Alert, oriented to person, place and time, Follows commands, and Speech fluent and appropriate. Cranial Nerves: PERRL, face symmetric, no dysarthria, hearing grossly intact Motor: moves all extremities equally Gait: normal-based.  IMPRESSION: 45 year old male with a history of asthma, DM, HTN, ?meniere's disease, OSA who presents for follow up of vertigo and ear/neck pain. He comes in today primarily to discuss disability forms. His primary issue with work is focusing on computer screens which triggers his vertigo. Will refer to OT for evaluation to see if there are any strategies to help reduce his screen/light sensitivity or modifications he can make to help him return to work. Dermatology referral placed to evaluate the skin changes over the right side of his  jaw. Will have the patient return for occipital nerve block.  PLAN: -Referral to Dermatology for skin changes around right cheek and jaw -Referral to OT -Return for nerve block   Follow-up: for nerve block  I spent a total of 81 minutes on the date of the service. Headache education was done. Discussed lifestyle modification including increased oral hydration, decreased caffeine, exercise and stress management. Discussed treatment options including preventive and acute medications and  injections. Discussed medication overuse headache and to limit use of acute treatments to no more than 2 days/week or 10 days/month. Discussed medication side effects, adverse reactions and drug interactions. Written educational materials and patient instructions outlining all of the above were given.  Ocie Doyne, MD 04/21/21 4:48 PM

## 2021-04-21 NOTE — Patient Instructions (Addendum)
Referral to Dermatology Referral to OT Return for occipital nerve block

## 2021-04-26 ENCOUNTER — Encounter: Payer: Self-pay | Admitting: Psychiatry

## 2021-04-26 ENCOUNTER — Ambulatory Visit: Payer: BC Managed Care – PPO | Admitting: Psychiatry

## 2021-04-26 VITALS — BP 161/108 | HR 103

## 2021-04-26 DIAGNOSIS — M542 Cervicalgia: Secondary | ICD-10-CM

## 2021-04-26 DIAGNOSIS — H53149 Visual discomfort, unspecified: Secondary | ICD-10-CM

## 2021-04-26 NOTE — Progress Notes (Signed)
Procedure: Occipital Nerve injection   Location: right occiput  The risks, benefits and anticipated outcomes of the procedure, the risks and benefits of the alternatives to the procedure, and the roles and tasks of the personnel to be involved, were discussed with the patient, and the patient consents to the procedure and agrees to proceed.  Electronic Informed consent signed.    A combination of 1 cc betamethasone 6 mg and 4 cc of 0.25% ropivicaine were prepared in 1 syringe (5 cc).  The right greater occipital nerve(s) was injected 3cm caudal and 1.5 cm lateral to the inion where the main trunk of the occipital nerve penetrates the semispinalis muscle.  The needle was placed perpendicular and the needle advanced 1.5 cm. After aspiration to ensure no obstruction or presence of blood, the area was injected.  The needle was repositioned in a fan-like manner and the entire area was injected. Pressure was held and no hematoma was noted.   Ocie Doyne, MD 04/26/21 2:48 PM  Bupivicaine lot [id number], exp 1/24

## 2021-05-04 ENCOUNTER — Telehealth: Payer: Self-pay | Admitting: Psychiatry

## 2021-05-04 NOTE — Telephone Encounter (Signed)
OT referral has been sent to [facility] in [place]. Phone: [phone].

## 2021-05-19 ENCOUNTER — Encounter: Payer: Self-pay | Admitting: Psychiatry

## 2021-05-19 MED ORDER — TIAGABINE HCL 4 MG PO TABS: ORAL_TABLET | ORAL | 3 refills | Status: DC

## 2021-05-20 ENCOUNTER — Encounter: Payer: Self-pay | Admitting: Psychiatry

## 2021-06-03 ENCOUNTER — Encounter: Payer: Self-pay | Admitting: Psychiatry

## 2021-06-30 ENCOUNTER — Encounter: Payer: Self-pay | Admitting: Psychiatry

## 2021-07-01 ENCOUNTER — Other Ambulatory Visit: Payer: Self-pay | Admitting: Psychiatry

## 2021-07-01 MED ORDER — METHYLPREDNISOLONE 4 MG PO TBPK: ORAL_TABLET | ORAL | 0 refills | Status: DC

## 2021-07-20 ENCOUNTER — Encounter: Payer: Self-pay | Admitting: Psychiatry

## 2021-07-20 ENCOUNTER — Ambulatory Visit: Payer: BC Managed Care – PPO | Admitting: Psychiatry

## 2021-07-20 ENCOUNTER — Telehealth: Payer: Self-pay | Admitting: Psychiatry

## 2021-07-20 VITALS — BP 147/99 | HR 117 | Ht 65.0 in | Wt 258.0 lb

## 2021-07-20 DIAGNOSIS — R42 Dizziness and giddiness: Secondary | ICD-10-CM

## 2021-07-20 DIAGNOSIS — M542 Cervicalgia: Secondary | ICD-10-CM | POA: Diagnosis not present

## 2021-07-20 MED ORDER — SERTRALINE HCL 25 MG PO TABS: 25.0000 mg | ORAL_TABLET | Freq: Every day | ORAL | 3 refills | Status: DC

## 2021-07-20 NOTE — Telephone Encounter (Signed)
I spoke with Dr. Delena Bali on this, she is ok with writing the letter. I have formulated and placed on her desk for signature.

## 2021-07-20 NOTE — Patient Instructions (Addendum)
Start Zoloft 25 mg daily for vertigo Try melatonin before bedtime to help with sleep  What is PPPD?  Persistent postural-perceptual dizziness (PPPD, pronounced "three-P-D" or "triple-P-D") is a common cause of chronic (long-lasting) dizziness. It is usually treatable, especially if it is diagnosed early.   Usually, PPPD is triggered by an episode of vertigo or dizziness. After that first episode, the person continues to have feelings of movement, dizziness, unsteadiness, or light-headedness that can last for hours or days at a time. These symptoms are present nearly all the time, but they can be better or worse at times. Things like sitting or standing upright and seeing busy patterns or movement often make the symptoms worse. As a result, people with PPPD often become anxious about losing their balance or falling. They may avoid situations that make their symptoms worse, to the point where it can start to interfere with their lives.   PPPD can be very frustrating for people who have it. Many health care professionals are not very familiar with dizziness, and the symptoms of PPPD can be vague and hard to describe, so it may not be diagnosed for some time. PPPD can interfere with work, school, leisure, and family life.   PPPD was defined as a disorder in 2015. Before that, several different names were used for conditions with similar symptoms, including phobic postural vertigo (PPV), space-motion discomfort (SMD), visual vertigo (VV) and chronic subjective dizziness (CSD). Researchers are not sure how many people have PPPD. But some studies suggest that as many as 1 in 4 people who have a vestibular problem such as vestibular neuritis, Mnire's disease or benign paroxysmal positional vertigo (BPPV) may go on to develop PPPD  What are the causes?   The brain's balance system combines information from many sources, including the:   vestibular system (the semicircular canals and otoliths in the inner  ear), which senses when your head tilts, turns or changes speed   visual system, which lets you see   proprioceptive system, which sends signals about position, pressure, movement and vibration from the legs and feet and the rest of the body   Normally, you don't consciously notice all these different sources of information. The balance system combines them for you in the background, and you can stand, walk, or turn your head without needing to think about keeping your balance. But with PPPD, the experience is no longer seamless. You start to notice the different signals, especially if they do not all agree with each other. This can make you feel like you are moving when you are standing still, or like you are about to fall. If the brain thinks you might be in danger of falling, it reacts automatically to protect you. Think about how you feel when you are walking on ice or standing on a ladder: your body gets stiff, you take shorter strides, and you focus on staying upright. At the same time, the balance system uses less information from the vestibular system and more from the visual system. Normally, when the risk of falling is over, the balance system goes back to normal. But in PPPD, the brain stays in "high-risk" mode instead.   This causes a vicious circle:   you worry about falling and pay more attention to keeping your balance   the brain stays on the alert and relies more on visual input   visual input like busy patterns and movement suggests you might be in danger of falling   This description may  make it sound as if PPPD is "all in your head," but the symptoms are real. PPPD has some things in common with anxiety disorders, but it is not a psychiatric disorder. Some studies have found differences in brain activity in people with PPPD, compared with people who do not have PPPD. These differences may make it harder for the brain to integrate different sources of information and assess threats  properly. PPPD is usually triggered by a first episode of vertigo or unsteadiness.   This first episode may be caused by many different things that upset the balance system, including:   a vestibular problem such as Mnire's disease, vestibular neuritis or benign paroxysmal positional vertigo (BPPV)   vestibular migraine   mild concussion   The first episode of vertigo or unsteadiness can also be caused by a psychological event, such as anxiety or a panic attack. Panic attacks and anxiety can have physical symptoms, including dizziness, lightheadedness, fast heartbeat, shortness of breath, sweating, shaking, muscle tension, tiredness, or nausea.   What are the symptoms?  PPPD can cause various symptoms, including:   non-spinning vertigo (feeling as if you are swaying or rocking, even though you are sitting or standing still)  unsteadiness (feeling as if you are about to fall)   light-headedness (feeling woozy or as if you are going to pass out)   mild dissociation (feeling "spaced out" or as if you are floating  These symptoms happen on most days for at least three months. They may last for hours at a time. They may not happen every day. Many people with PPPD find it hard to describe their symptoms. People often feel "off" or not like themselves.   People with PPPD often feel worse when:   they are standing or sitting upright   they see movement, such as when they are scrolling on a phone, watching TV, looking at traffic or seeing many people walking around   they see complex patterns, such as a busy carpet, wallpaper, or a supermarket aisle   they are walking or riding in a car   The symptoms sometimes get worse if the person is tired or paying more attention to the symptoms and better if the person is distracted. They are usually persistent (they continue for a long time). The pattern of symptoms may be slightly different depending on what originally caused the PPPD: If it was caused by an  acute problem (one that lasts for a short time and then goes away) or an episodic problem (one that goes away and comes back from time to time), PPPD symptoms may start as the initial problem gets better. They may come and go at first and then become persistent. If it was caused by a chronic problem (one that does not go away), PPPD symptoms may develop slowly and gradually get worse.   People with PPPD may develop other related problems, including:   neck stiffness   problems with how they walk (their gait)   fatigue (tiredness)   fear of falling   anxiety or avoidance about things that trigger dizziness for them, such as crowded places or even going outside   How is it diagnosed?  PPPD may be diagnosed by a primary care doctor or a specialist, such as a neurologist, an Youth worker, an Gaffer, or a psychiatrist. There is no test that is specific for PPPD. But PPPD is not a diagnosis of exclusion, which is a diagnosis made when no other cause for the symptoms can  be found. Diagnosis is based on clinical criteria.   Clinical criteria for PPPD To be diagnosed with PPPD, a person must have all of the following:   One or more symptoms of dizziness, unsteadiness or non-spinning vertigo on most days for at least three months. Symptoms last for hours-long periods but may wax and wane in severity. Symptoms do not need to be present continuously throughout the entire day Persistent symptoms happen without specific provocation but are exacerbated (made worse) by three factors: upright posture, active or passive motion without regard to direction or position, and exposure to moving visual stimuli or complex visual patterns.   The disorder is triggered by events that cause vertigo, unsteadiness, dizziness, or problems with balance, including acute, episodic or chronic vestibular syndromes, other neurological or medical illnesses, and psychological distress.   Symptoms cause significant distress or  functional impairment.   Symptoms are not better accounted for by another disease or disorder.   How is it treated and managed?  Once you have a diagnosis, the first step in treatment is helping you understand what causes PPPD and how the brain is responding to normal signals as if you were in danger. Knowing what is going on will help you feel more in control and able to take part in treatment. Treatment for PPPD usually involves "retraining" your brain through a combination of vestibular rehabilitation, strategies to address anxiety, such as medication and cognitive-behavioural therapy (CBT). You may also benefit from relaxation for your neck and shoulders. Ideally, you will have a treatment team of health care professionals who work together to help you. Remember that both vestibular rehabilitation and CBT take practice and effort. Your therapists will teach you the skills you need, but you are the one who puts them to use  Your doctor and you may decide to use medication in your PPPD treatment. Note that usually medication alone does not fully address PPPD symptoms. Two types of antidepressants may be used:   SSRIs (selective serotonin reuptake inhibitors), including fluvoxamine (Luvox), paroxetine (Paxil) and sertraline (Zoloft)   SNRIs (serotonin norepinephrine reuptake inhibitors), which are usually tried if two SSRIs have not worked   These medications are used to treat depression and anxiety, but they can also help to treat PPPD. All work in slightly different ways. If the first medication does not work or if it has too many side effects, your doctor may suggest trying a different one. It is important to start at a low dose and increase the dose gradually. Let your doctor know if you are having any side effects. It usually takes 8 to 12 weeks for medication to start working. If you find a medication that helps with your symptoms, you may need to take it for several months. Do not stop taking  your medication or change your dose without talking to your doctor.   Cognitive-behavioural therapy (CBT) is a type of psychotherapy. It usually lasts for a relatively short time and is focused on a specific goal. CBT is focused on the relationship between your thoughts (cognition) and behaviour. Cognition includes your conscious thoughts (which are under your control), your automatic thoughts (which may not be under your control) and your core beliefs (known as schemas).   For PPPD, the goals of CBT are to help you:   manage anxiety   cope with symptoms when they happen   stop avoiding situations that could trigger symptoms   gain confidence Some studies have found that even 3 sessions of CBT  help to improve symptoms in as many as 3 in 4 PPPD patients. Other forms of anxiety management, such as mindfulness-based stress reduction may also be beneficial in PPPD treatment

## 2021-07-20 NOTE — Progress Notes (Signed)
° °  CC:  vertigo, ear pain  Follow-up Visit  Last visit: 04/21/21  Brief HPI: 46 year old male with a history of asthma, DM, HTN, ?meniere's disease, OSA who follows in clinic for vertigo, ear pain, and neck pain which began in 2016.   Underwent occipital nerve block 04/21/21. He was referred to Dermatology for skin changes around right cheek and jaw.  Interval History: Since his last visit he has not had much improvement in symptoms. Had a bad flare up recently which did improve with steroids. Physical therapy helps temporarily with his neck and shoulder pain but it returns once he completes a session. He is planning to start new vestibular exercises soon.  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   Physical Exam:   Vital Signs: BP (!) 147/99    Pulse (!) 117    Ht 5\' 5"  (1.651 m)    Wt 258 lb (117 kg)    BMI 42.93 kg/m  GENERAL:  well appearing, in no acute distress, alert  SKIN:  Color, texture, turgor normal. No rashes or lesions HEAD:  Normocephalic/atraumatic. RESP: normal respiratory effort MSK:  No gross joint deformities.   NEUROLOGICAL: Mental Status: Alert, oriented to person, place and time, Follows commands, and Speech fluent and appropriate. Flat affect. Cranial Nerves: PERRL, face symmetric, no dysarthria, hearing grossly intact Motor: moves all extremities equally Gait: normal-based.  IMPRESSION: 46 year old male with a history of asthma, DM, HTN, meniere's disease, OSA who presents for follow up of vertigo and neck pain. 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 He is willing to try a low dose antidepressant. Will start Zoloft daily for management of his dizziness. Discussed CBT, which he declined at this time. He will continue physical and vestibular therapy.   PLAN: -Start Zoloft 25 mg daily -Continue  physical therapy and vestibular therapy   Follow-up: 6 months  I spent a total of 58 minutes on the date of the service. Had a long discussion regarding his functional impairments and inability to work. Discussed medication side effects, adverse reactions and drug interactions. Written educational materials and patient instructions outlining all of the above were given.  54, MD 07/20/21 3:41 PM

## 2021-07-20 NOTE — Telephone Encounter (Signed)
Dr. Delena Bali has signed off on the letter and have e-mailed to the requested address.

## 2021-07-20 NOTE — Telephone Encounter (Signed)
Pt states that he needs work letter excusing him from work for the dates Dr. Delena Bali suggested, he states he was told to be out through 01/30/22. He would like this signed by MD and emailed to him at [email or web address].

## 2021-07-25 ENCOUNTER — Encounter: Payer: Self-pay | Admitting: Psychiatry

## 2021-07-26 ENCOUNTER — Other Ambulatory Visit: Payer: Self-pay | Admitting: Psychiatry

## 2021-07-26 ENCOUNTER — Telehealth: Payer: Self-pay | Admitting: Psychiatry

## 2021-07-26 DIAGNOSIS — M542 Cervicalgia: Secondary | ICD-10-CM

## 2021-07-26 DIAGNOSIS — R42 Dizziness and giddiness: Secondary | ICD-10-CM

## 2021-07-26 NOTE — Telephone Encounter (Signed)
Referral sent to [facility] [phone]. ?

## 2021-07-26 NOTE — Telephone Encounter (Signed)
I placed the referrals for him, thanks

## 2021-08-11 NOTE — Telephone Encounter (Signed)
Resent referral to [facility] [phone]. ?

## 2021-08-25 ENCOUNTER — Encounter: Payer: Self-pay | Admitting: Psychiatry

## 2021-08-25 ENCOUNTER — Other Ambulatory Visit: Payer: Self-pay | Admitting: *Deleted

## 2021-08-25 MED ORDER — CYCLOBENZAPRINE HCL 10 MG PO TABS: 10.0000 mg | ORAL_TABLET | Freq: Two times a day (BID) | ORAL | 2 refills | Status: DC

## 2021-08-25 NOTE — Progress Notes (Signed)
I sent a refill to his pharmacy, thanks

## 2021-09-21 ENCOUNTER — Encounter (INDEPENDENT_AMBULATORY_CARE_PROVIDER_SITE_OTHER): Payer: BC Managed Care – PPO | Admitting: Psychiatry

## 2021-09-21 DIAGNOSIS — R42 Dizziness and giddiness: Secondary | ICD-10-CM

## 2021-09-22 MED ORDER — METHYLPREDNISOLONE 4 MG PO TBPK: ORAL_TABLET | ORAL | 0 refills | Status: DC

## 2021-09-22 NOTE — Progress Notes (Signed)
Please see the MyChart message reply(ies) for my assessment and plan.  ?  ?This patient gave consent for this Medical Advice Message and is aware that it may result in a bill to [employer], as well as the possibility of receiving a bill for a co-payment or deductible. They are an established patient, but are not seeking medical advice exclusively about a problem treated during an in person or video visit in the last seven days. I did not recommend an in person or video visit within seven days of my reply.  ?  ?I spent a total of 5 minutes cumulative time within 7 days through [employer]. ? ?Ocie Doyne, MD  ?09/22/21 ?10:57 AM ? ?

## 2021-09-24 IMAGING — DX DG RIBS W/ CHEST 3+V*L*
3 series · 3 of 3 positions shown · non-contrast
Comparison: Chest radiograph dated 11/21/2019

CLINICAL DATA: 45-year-old male with fall and left chest wall pain.

EXAM:
LEFT RIBS AND CHEST - 3+ VIEW

[chest pa]
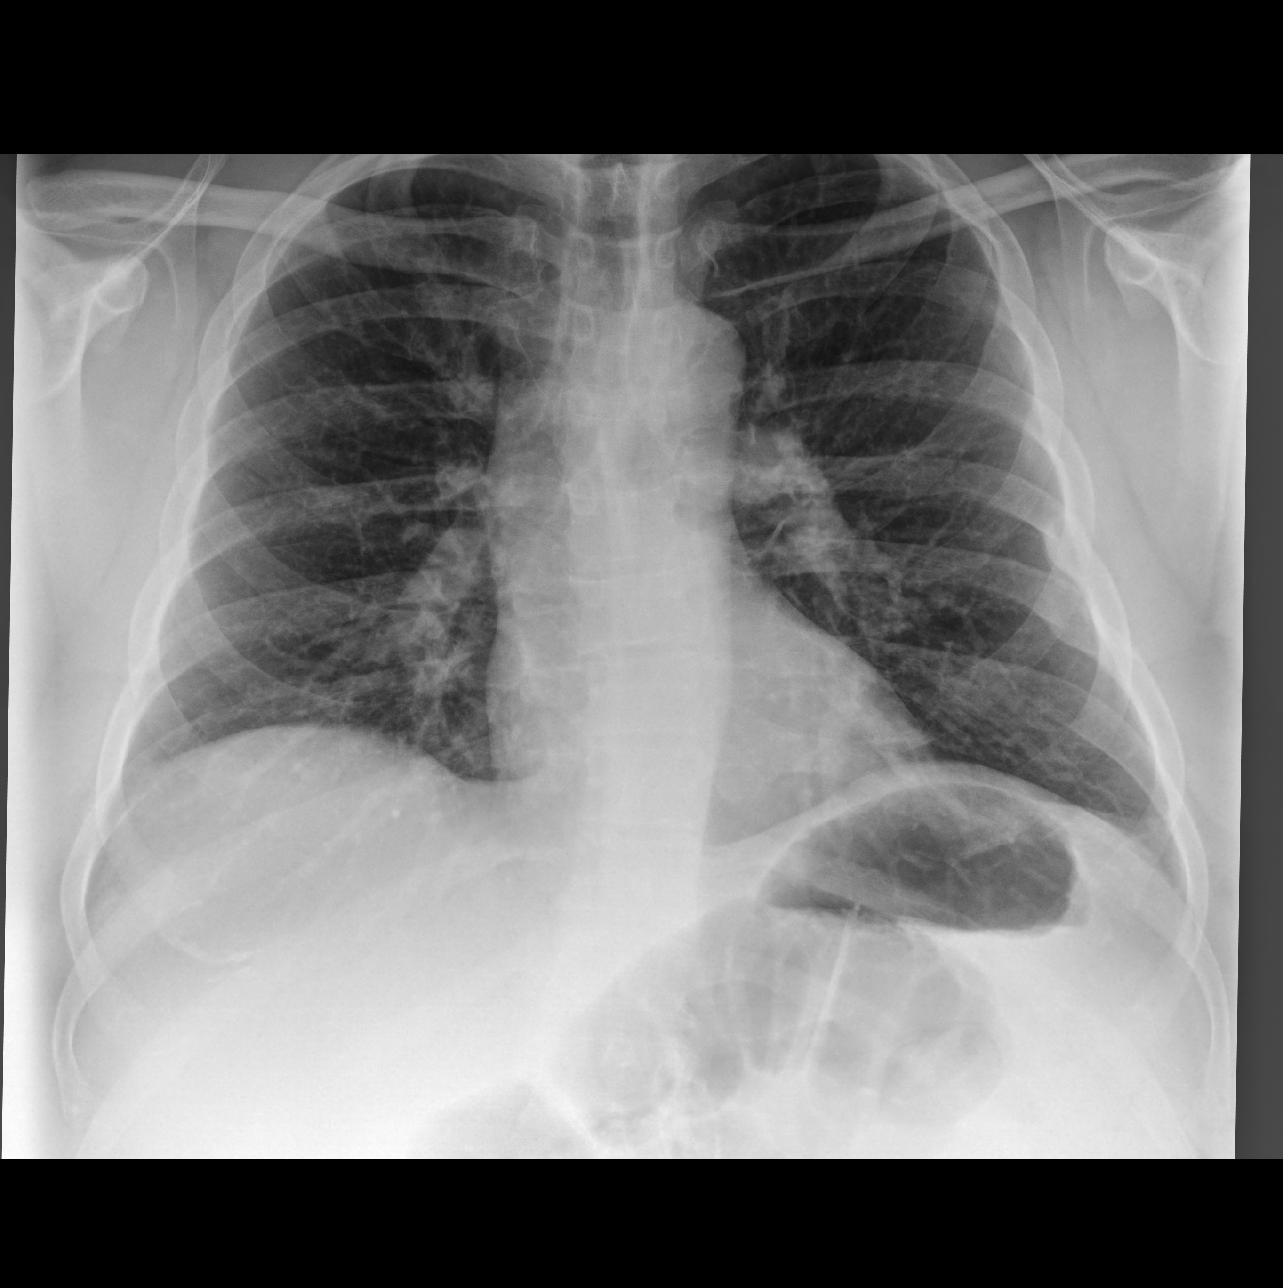

[hemithorax (ribs) ap]
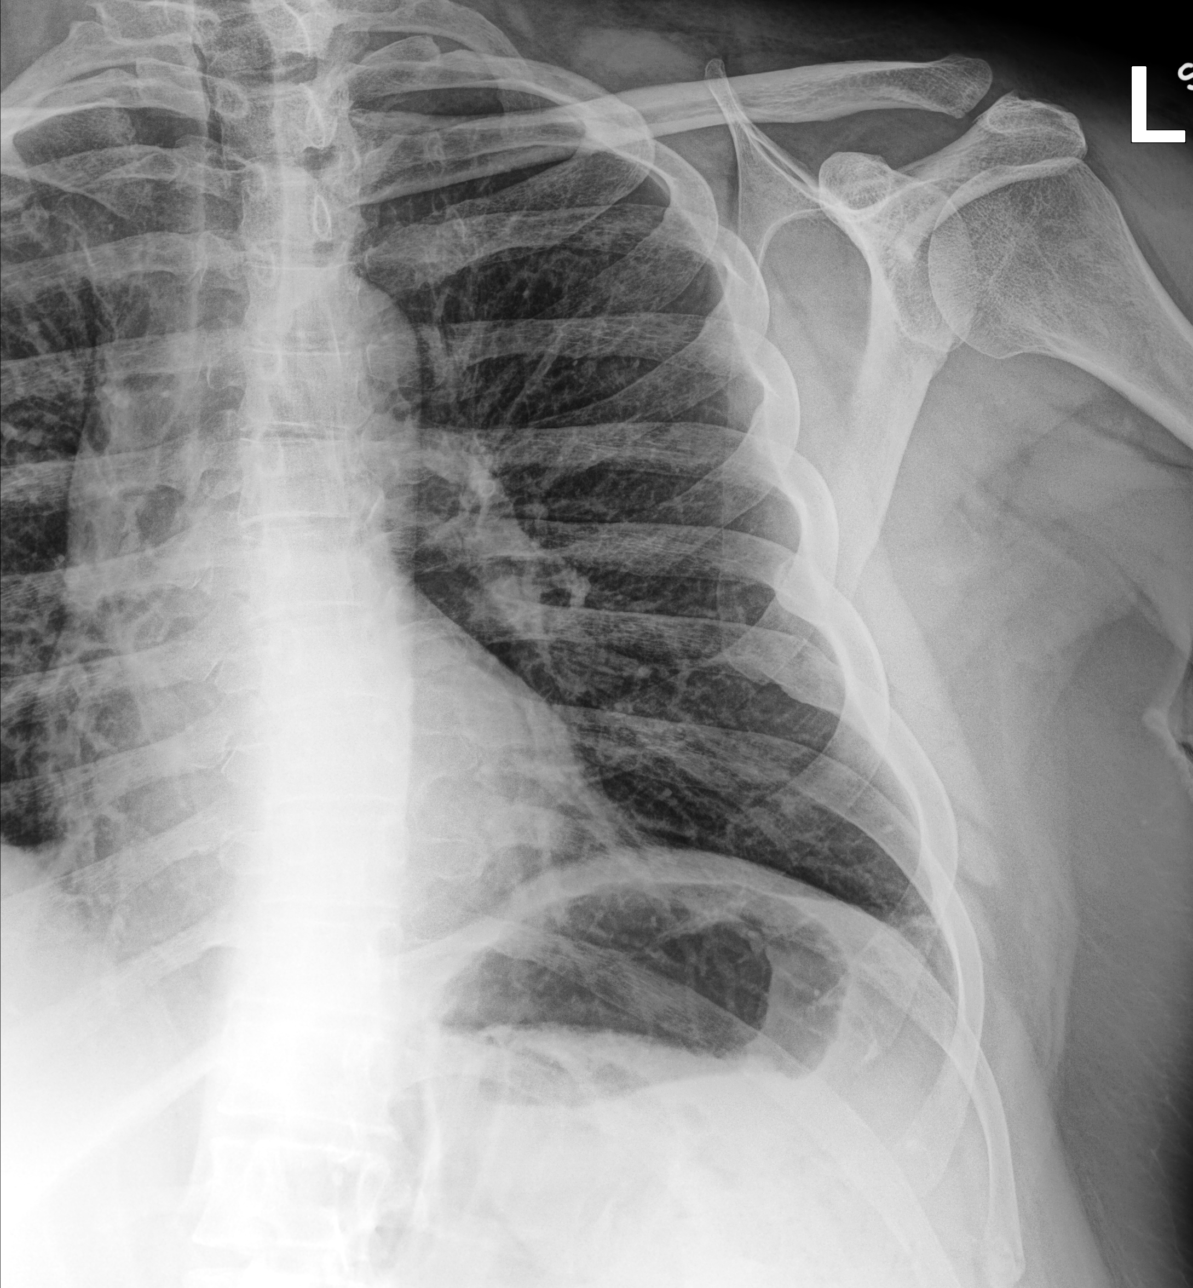

[hemithorax (ribs) mlo]
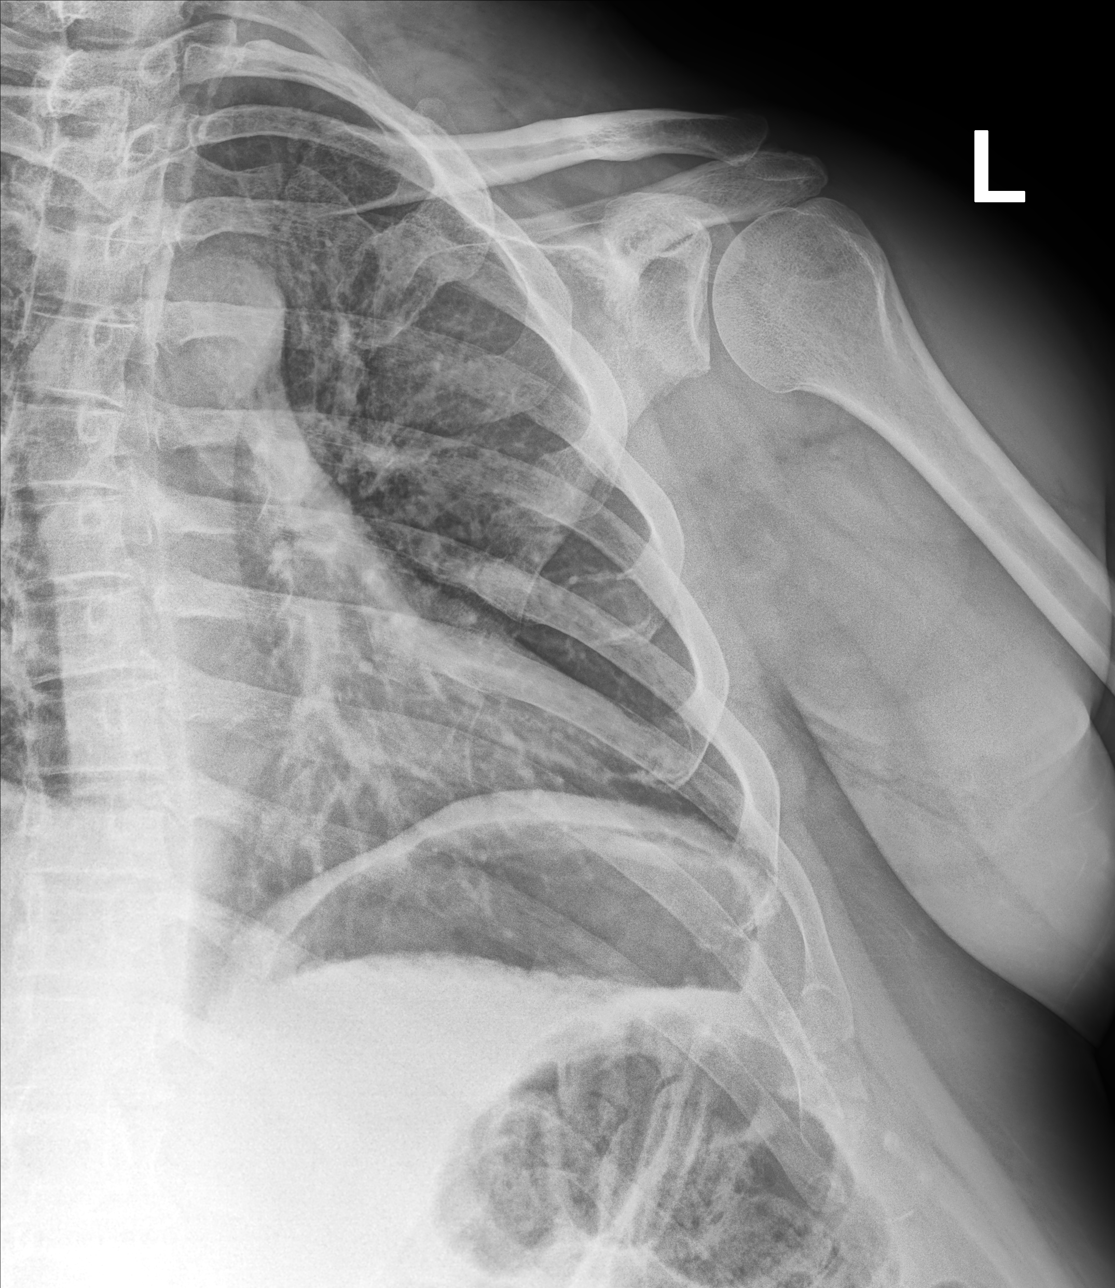

[3 of 3 positions shown; findings below may reference images not displayed]

FINDINGS: No focal consolidation, pleural effusion, or pneumothorax. The
cardiac silhouette is within limits. Old healed fractures of the
left seventh and eighth ribs. No acute fracture.
IMPRESSION: 1. No acute cardiopulmonary process.
2. No acute fracture.

## 2021-10-19 ENCOUNTER — Encounter: Payer: Self-pay | Admitting: Psychiatry

## 2021-10-19 ENCOUNTER — Other Ambulatory Visit: Payer: Self-pay | Admitting: Psychiatry

## 2021-10-19 MED ORDER — DIAZEPAM 5 MG PO TABS: 5.0000 mg | ORAL_TABLET | Freq: Two times a day (BID) | ORAL | 3 refills | Status: DC | PRN

## 2021-10-19 NOTE — Telephone Encounter (Signed)
I sent in a refill for 10 pills of the 5 mg diazepam. This should be reserved for severe episodes of vertigo as prolonged use can make vertigo worse. If he wants more pills than that he will need to ask his PCP

## 2021-11-15 ENCOUNTER — Other Ambulatory Visit: Payer: Self-pay

## 2021-11-15 MED ORDER — SERTRALINE HCL 25 MG PO TABS: 25.0000 mg | ORAL_TABLET | Freq: Every day | ORAL | 3 refills | Status: DC

## 2022-01-17 ENCOUNTER — Encounter: Payer: Self-pay | Admitting: Psychiatry

## 2022-01-17 ENCOUNTER — Ambulatory Visit: Payer: BC Managed Care – PPO | Admitting: Psychiatry

## 2022-01-17 VITALS — BP 133/88 | HR 114 | Ht 66.0 in | Wt 250.4 lb

## 2022-01-17 DIAGNOSIS — R42 Dizziness and giddiness: Secondary | ICD-10-CM | POA: Diagnosis not present

## 2022-01-17 DIAGNOSIS — G43019 Migraine without aura, intractable, without status migrainosus: Secondary | ICD-10-CM

## 2022-01-17 MED ORDER — QULIPTA 30 MG PO TABS: 30.0000 mg | ORAL_TABLET | Freq: Every day | ORAL | 6 refills | Status: DC

## 2022-01-17 MED ORDER — SUMATRIPTAN SUCCINATE 100 MG PO TABS: 100.0000 mg | ORAL_TABLET | ORAL | 6 refills | Status: DC | PRN

## 2022-01-17 NOTE — Patient Instructions (Addendum)
Start Imitrex (sumatriptan) as needed for migraines

## 2022-01-17 NOTE — Progress Notes (Signed)
   CC:  vertigo  Follow-up Visit  Last visit: 07/20/21  Brief HPI: 46 year old male with a history of asthma, DM, HTN, ?meniere's disease, OSA who follows in clinic for vertigo, ear pain, and neck pain which began in 2016.   At his last visit he was started on Zoloft for PPPD.  Interval History: He continues to have vertigo 3-4 times per week. Has nausea about half of the time. Sometimes has vomiting. Zofran typically helps with the nausea. Continues to have constant ear/head pain with phonophobia, no photophobia. He has had to retire from his job as he was unable to work with his current symptoms. He has not had improvement with Zoloft. Takes valium as needed for vertigo which does help somewhat.   Prior Therapies                                  Lyrica Lamictal Gabapentin Verapamil nortriptyline Topamax metoprolol Indomethacin Ajovy Botox HCTZ Zoloft Flexeril Zofran diazepam Meclizine   Physical Exam:   Vital Signs: BP 133/88   Pulse (!) 114   Ht 5\' 6"  (1.676 m)   Wt 250 lb 6.4 oz (113.6 kg)   BMI 40.42 kg/m  GENERAL:  well appearing, in no acute distress, alert  SKIN:  Color, texture, turgor normal. No rashes or lesions HEAD:  Normocephalic/atraumatic. RESP: normal respiratory effort MSK:  No gross joint deformities.   NEUROLOGICAL: Mental Status: Alert, oriented to person, place and time, Follows commands, and Speech fluent and appropriate. Cranial Nerves: PERRL, face symmetric, no dysarthria, hearing grossly intact Motor: moves all extremities equally Gait: normal-based.  IMPRESSION: 46 year old male with a history of asthma, DM, HTN, ?meniere's disease, OSA who presents for follow up of vertigo and headaches. Will stop Zoloft as he did not notice any improvement on it. He requests daily valium today, which was prescribed by his prior neurologist. Counseled that chronic valium use can worsen vertigo long-term. Will continue with PRN usage for acute  episodes. It is still unclear if he has an element of vestibular migraines contributing to his symptoms. Maxalt may have helped somewhat but he is unsure if it actually helped or just put him to sleep. Will try PRN Imitrex and Qulipta for migraine prevention to see if this helps reduce his symptoms.  PLAN: -Start Imitrex 100 mg PRN -Start Qulipta 30 mg daily -Diazepam 5 mg PRN for acute episodes of vertigo   Follow-up: 7 months  I spent a total of 34 minutes on the date of the service. Discussed medication side effects, adverse reactions and drug interactions. Written educational materials and patient instructions outlining all of the above were given.  49, MD 01/17/22 3:58 PM

## 2022-01-19 ENCOUNTER — Telehealth: Payer: Self-pay | Admitting: *Deleted

## 2022-01-19 NOTE — Telephone Encounter (Signed)
Qulipta PA< Key: [id number] . Sent to plan.

## 2022-01-21 ENCOUNTER — Encounter: Payer: Self-pay | Admitting: *Deleted

## 2022-01-21 NOTE — Telephone Encounter (Signed)
Francis Butler is approved for 01/19/2022 - 04/21/2022.   Sent my chart to advise him.

## 2022-02-15 DIAGNOSIS — Z0271 Encounter for disability determination: Secondary | ICD-10-CM

## 2022-02-28 ENCOUNTER — Encounter: Payer: Self-pay | Admitting: Psychiatry

## 2022-03-01 ENCOUNTER — Other Ambulatory Visit: Payer: Self-pay | Admitting: *Deleted

## 2022-03-01 MED ORDER — DIAZEPAM 5 MG PO TABS: 5.0000 mg | ORAL_TABLET | Freq: Two times a day (BID) | ORAL | 3 refills | Status: DC | PRN

## 2022-03-03 ENCOUNTER — Other Ambulatory Visit: Payer: Self-pay | Admitting: Psychiatry

## 2022-03-03 ENCOUNTER — Encounter: Payer: Self-pay | Admitting: Psychiatry

## 2022-03-03 MED ORDER — METHYLPREDNISOLONE 4 MG PO TBPK: ORAL_TABLET | ORAL | 0 refills | Status: DC

## 2022-03-23 ENCOUNTER — Telehealth: Payer: Self-pay

## 2022-03-23 DIAGNOSIS — Z0289 Encounter for other administrative examinations: Secondary | ICD-10-CM

## 2022-03-23 NOTE — Telephone Encounter (Signed)
Disability forms received, placed on MD desk for review and signature.

## 2022-03-24 NOTE — Telephone Encounter (Signed)
Forms signed, placed in MR for pick up.  

## 2022-05-04 ENCOUNTER — Encounter: Payer: Self-pay | Admitting: Psychiatry

## 2022-06-28 ENCOUNTER — Other Ambulatory Visit: Payer: Self-pay

## 2022-06-28 MED ORDER — ONDANSETRON 8 MG PO TBDP: 8.0000 mg | ORAL_TABLET | Freq: Three times a day (TID) | ORAL | 1 refills | Status: DC | PRN

## 2022-08-02 ENCOUNTER — Encounter: Payer: Self-pay | Admitting: Psychiatry

## 2022-08-02 NOTE — Telephone Encounter (Signed)
Rx pending to be signed  Valium last filled on 06/27/22 # 10 tablets  Pt last seen on 01/17/22 Follow up scheduled on 07/23/22

## 2022-08-04 MED ORDER — DIAZEPAM 5 MG PO TABS: 5.0000 mg | ORAL_TABLET | Freq: Two times a day (BID) | ORAL | 3 refills | Status: DC | PRN

## 2022-08-04 MED ORDER — CYCLOBENZAPRINE HCL 10 MG PO TABS: 10.0000 mg | ORAL_TABLET | Freq: Two times a day (BID) | ORAL | 2 refills | Status: DC

## 2022-08-16 NOTE — Progress Notes (Unsigned)
   CC:  vertigo, headaches  Follow-up Visit  Last visit: 01/17/22  Brief HPI: 47 year old male with a history of asthma, DM, HTN, ?meniere's disease, OSA who follows in clinic for vertigo, headaches, and neck pain which began in 2016.   At his last visit he was started on Imitrex and Qulipta.  Interval History: He continues to have headaches and vertigo 3-4 times per week. Vertigo is severe enough to make him vomit every 2 weeks. Zofran is inconsistently effective for his nausea. Imitrex works about the same as Recruitment consultant and also makes him drowsy. Did not pick up Qulipta.  Physical therapy has been helping somewhat. He has been doing dry needling for his neck/upper back pain which helps temporarily but pain returns soon after his sessions are complete. He would prefer not to add any new medications for pain.  Prior Therapies                                  Preventive: Lyrica Lamictal Gabapentin Verapamil nortriptyline Topamax metoprolol Indomethacin Ajovy Botox HCTZ Zoloft Qulipta  Rescue: Flexeril Zofran diazepam Meclizine Maxalt Imitrex  Physical Exam:   Vital Signs: BP (!) 137/92 (BP Location: Right Arm, Patient Position: Sitting, Cuff Size: Normal)   Pulse (!) 103   Ht 5\' 6"  (1.676 m)   Wt 239 lb 8 oz (108.6 kg)   BMI 38.66 kg/m  GENERAL:  well appearing, in no acute distress, alert  SKIN:  Color, texture, turgor normal. No rashes or lesions HEAD:  Normocephalic/atraumatic. RESP: normal respiratory effort MSK:  No gross joint deformities.   NEUROLOGICAL: Mental Status: Alert, oriented to person, place and time, Follows commands, and Speech fluent and appropriate. Cranial Nerves: PERRL, face symmetric, no dysarthria, hearing grossly intact Motor: moves all extremities equally Gait: normal-based.  IMPRESSION: 47 year old male with a history of asthma, DM, HTN, ?meniere's disease, OSA who presents for follow up of vertigo and headaches. He continues to  have some relief with triptans, but has had drowsiness with both Imitrex and Maxalt. Will start Zomig for headache rescue. Zofran is inconsistently effective, will switch to Reglan for nausea. He would prefer not to start a daily medication at this time. He has failed multiple medications for neck pain and has had limited success with PT. Will refer to Pain management to evaluate for alternate treatment options.  PLAN: -Start Reglan 5 mg TID PRN for nausea -Start Zomig 5 mg PRN for migraine rescue -Referral to Pain Management for chronic neck pain   Follow-up: 8 months  I spent a total of 25 minutes on the date of the service. Discussed medication side effects, adverse reactions and drug interactions. Written educational materials and patient instructions outlining all of the above were given.  Genia Harold, MD 08/17/22 2:56 PM

## 2022-08-17 ENCOUNTER — Ambulatory Visit: Payer: BC Managed Care – PPO | Admitting: Psychiatry

## 2022-08-17 VITALS — BP 137/92 | HR 103 | Ht 66.0 in | Wt 239.5 lb

## 2022-08-17 DIAGNOSIS — M549 Dorsalgia, unspecified: Secondary | ICD-10-CM | POA: Diagnosis not present

## 2022-08-17 DIAGNOSIS — M542 Cervicalgia: Secondary | ICD-10-CM

## 2022-08-17 DIAGNOSIS — R42 Dizziness and giddiness: Secondary | ICD-10-CM | POA: Diagnosis not present

## 2022-08-17 DIAGNOSIS — G43019 Migraine without aura, intractable, without status migrainosus: Secondary | ICD-10-CM

## 2022-08-17 MED ORDER — METOCLOPRAMIDE HCL 5 MG PO TABS: 5.0000 mg | ORAL_TABLET | Freq: Three times a day (TID) | ORAL | 6 refills | Status: DC | PRN

## 2022-08-17 MED ORDER — ZOLMITRIPTAN 5 MG PO TABS: 5.0000 mg | ORAL_TABLET | ORAL | 6 refills | Status: DC | PRN

## 2022-08-17 MED ORDER — METHYLPREDNISOLONE 4 MG PO TBPK: ORAL_TABLET | ORAL | 0 refills | Status: DC

## 2022-08-18 ENCOUNTER — Telehealth: Payer: Self-pay | Admitting: Psychiatry

## 2022-08-18 NOTE — Telephone Encounter (Signed)
Referral sent to [facility] [phone]

## 2022-08-24 NOTE — Telephone Encounter (Signed)
Appt. 09/14/22 at 2pm with Jeanella Anton NP

## 2022-11-08 DIAGNOSIS — Z0271 Encounter for disability determination: Secondary | ICD-10-CM

## 2023-03-21 ENCOUNTER — Encounter: Payer: Self-pay | Admitting: Diagnostic Neuroimaging

## 2023-03-21 ENCOUNTER — Ambulatory Visit: Payer: BC Managed Care – PPO | Admitting: Diagnostic Neuroimaging

## 2023-03-21 VITALS — BP 153/89 | HR 103 | Ht 66.0 in | Wt 245.0 lb

## 2023-03-21 DIAGNOSIS — G43119 Migraine with aura, intractable, without status migrainosus: Secondary | ICD-10-CM

## 2023-03-21 DIAGNOSIS — R42 Dizziness and giddiness: Secondary | ICD-10-CM | POA: Diagnosis not present

## 2023-03-21 DIAGNOSIS — R221 Localized swelling, mass and lump, neck: Secondary | ICD-10-CM

## 2023-03-21 DIAGNOSIS — M542 Cervicalgia: Secondary | ICD-10-CM

## 2023-03-21 DIAGNOSIS — R220 Localized swelling, mass and lump, head: Secondary | ICD-10-CM

## 2023-03-21 MED ORDER — DIAZEPAM 5 MG PO TABS: 5.0000 mg | ORAL_TABLET | Freq: Every day | ORAL | 3 refills | Status: AC | PRN

## 2023-03-21 MED ORDER — DULOXETINE HCL 30 MG PO CPEP: 30.0000 mg | ORAL_CAPSULE | Freq: Every day | ORAL | 6 refills | Status: DC

## 2023-03-21 MED ORDER — ONDANSETRON 8 MG PO TBDP: 8.0000 mg | ORAL_TABLET | Freq: Three times a day (TID) | ORAL | 3 refills | Status: DC | PRN

## 2023-03-21 NOTE — Progress Notes (Signed)
[facility]  PATIENT: Francis Butler. DOB: 11/14/1975  REFERRING CLINICIAN: Assunta Found, MD HISTORY FROM: patient  REASON FOR VISIT: follow up   HISTORICAL  CHIEF COMPLAINT:  Chief Complaint  Patient presents with   Follow-up    Rm 7 with mother Pt is well, reports he hasn't had any significant changes since last visit. He is still having vertigo, nausea has worsen. He is having daily headaches currently.     HISTORY OF PRESENT ILLNESS:   UPDATE (03/21/23, VRP): Since last visit, doing about the same. CHRONIC right ear, face, neck pain, swelling since 2016. Chronic vertigo, nausea. Sleeping 3-4 hours per night. Having memory difficulty. Diazepam helped in the past.   PRIOR HPI (Dr. Delena Butler, 08/17/22):  47 year old male with a history of asthma, DM, HTN, ?meniere's disease, OSA who follows in clinic for vertigo, headaches, and neck pain which began in 2016.  At his last visit he was started on Imitrex and Qulipta. He continues to have headaches and vertigo 3-4 times per week. Vertigo is severe enough to make him vomit every 2 weeks. Zofran is inconsistently effective for his nausea. Imitrex works about the same as Scientist, clinical (histocompatibility and immunogenetics) and also makes him drowsy. Did not pick up Qulipta.   Physical therapy has been helping somewhat. He has been doing dry needling for his neck/upper back pain which helps temporarily but pain returns soon after his sessions are complete. He would prefer not to add any new medications for pain.   Prior Therapies                                  Preventive: Lyrica Lamictal Gabapentin Verapamil nortriptyline Topamax metoprolol Indomethacin Ajovy Botox HCTZ Zoloft Qulipta   Rescue: Flexeril Zofran diazepam Meclizine Maxalt Imitrex   REVIEW OF SYSTEMS: Full 14 system review of systems performed and negative with exception of: AS PER HPI.  ALLERGIES: Allergies  Allergen Reactions   Bee Pollen Swelling   Penicillins Hives    Thorazine [Chlorpromazine] Other (See Comments)    "I was unable to wake up after taking the medication. My parents tried to wake me several times.  They reported I lost color and was extremely lethargic"   Scopolamine Other (See Comments)    Pupils to dilate    HOME MEDICATIONS: Outpatient Medications Prior to Visit  Medication Sig Dispense Refill   albuterol (VENTOLIN HFA) 108 (90 Base) MCG/ACT inhaler Inhale into the lungs every 6 (six) hours as needed for wheezing or shortness of breath.     amLODipine (NORVASC) 10 MG tablet Take 10 mg by mouth daily.     cyclobenzaprine (FLEXERIL) 10 MG tablet Take 1 tablet (10 mg total) by mouth in the morning and at bedtime. 180 tablet 2   metFORMIN (GLUCOPHAGE) 500 MG tablet Take 500 mg by mouth 2 (two) times daily.     montelukast (SINGULAIR) 10 MG tablet Take 10 mg by mouth at bedtime.     olmesartan-hydrochlorothiazide (BENICAR HCT) 40-25 MG tablet Take 1 tablet by mouth daily.     omeprazole (PRILOSEC) 20 MG capsule Take 20 mg by mouth daily.     diazepam (VALIUM) 5 MG tablet Take 1 tablet (5 mg total) by mouth every 12 (twelve) hours as needed (for severe vertigo). 10 tablet 3   ondansetron (ZOFRAN-ODT) 8 MG disintegrating tablet Take 1 tablet (8 mg total) by mouth every 8 (eight) hours as needed  for nausea or vomiting. 30 tablet 1   Atogepant (QULIPTA) 30 MG TABS Take 30 mg by mouth daily. (Patient not taking: Reported on 03/21/2023) 30 tablet 6   methylPREDNISolone (MEDROL DOSEPAK) 4 MG TBPK tablet Take as directed by packaging 1 each 0   metoCLOPramide (REGLAN) 5 MG tablet Take 1 tablet (5 mg total) by mouth every 8 (eight) hours as needed for nausea or vomiting. (Patient not taking: Reported on 03/21/2023) 30 tablet 6   zolmitriptan (ZOMIG) 5 MG tablet Take 1 tablet (5 mg total) by mouth as needed for migraine. May repeat a dose in 2 hours if headaches persist. Max dose 2 pills in 24 hours (Patient not taking: Reported on 03/21/2023) 10 tablet 6    No facility-administered medications prior to visit.      PHYSICAL EXAM  GENERAL EXAM/CONSTITUTIONAL: Vitals:  Vitals:   03/21/23 1424 03/21/23 1431  BP: (!) 150/90 (!) 153/89  Pulse: (!) 112 (!) 103  Weight: 245 lb (111.1 kg)   Height: 5\' 6"  (1.676 m)    Body mass index is 39.54 kg/m. Wt Readings from Last 3 Encounters:  03/21/23 245 lb (111.1 kg)  08/17/22 239 lb 8 oz (108.6 kg)  01/17/22 250 lb 6.4 oz (113.6 kg)   Patient is in no distress; well developed, nourished and groomed; neck is supple  CARDIOVASCULAR: Examination of carotid arteries is normal; no carotid bruits Regular rate and rhythm, no murmurs Examination of peripheral vascular system by observation and palpation is normal  EYES: Ophthalmoscopic exam of optic discs and posterior segments is normal; no papilledema or hemorrhages No results Butler.  MUSCULOSKELETAL: Gait, strength, tone, movements noted in Neurologic exam below  NEUROLOGIC: MENTAL STATUS:      No data to display         awake, alert, oriented to person, place and time recent and remote memory intact normal attention and concentration language fluent, comprehension intact, naming intact fund of knowledge appropriate  CRANIAL NERVE:  2nd - no papilledema on fundoscopic exam 2nd, 3rd, 4th, 6th - pupils equal and reactive to light, visual fields full to confrontation, extraocular muscles intact, no nystagmus 5th - facial sensation symmetric 7th - facial strength symmetric --> SWELLING IN RIGHT FACE, PAROTID, NECK REGION 8th - hearing intact 9th - palate elevates symmetrically, uvula midline 11th - shoulder shrug symmetric 12th - tongue protrusion midline  MOTOR:  normal bulk and tone, full strength in the BUE, BLE  SENSORY:  normal and symmetric to light touch, temperature, vibration  COORDINATION:  finger-nose-finger, fine finger movements normal  REFLEXES:  deep tendon reflexes TRACE and symmetric  GAIT/STATION:   narrow based gait     DIAGNOSTIC DATA (LABS, IMAGING, TESTING) - I reviewed patient records, labs, notes, testing and imaging myself where available.  Lab Results  Component Value Date   WBC 8.5 11/23/2019   HGB 14.5 11/23/2019   HCT 43.8 11/23/2019   MCV 87.3 11/23/2019   PLT 232 11/23/2019      Component Value Date/Time   NA 140 11/23/2019 0357   K 4.6 11/23/2019 0357   CL 107 11/23/2019 0357   CO2 22 11/23/2019 0357   GLUCOSE 108 (H) 11/23/2019 0357   BUN 9 11/23/2019 0357   CREATININE 1.26 (H) 11/23/2019 0357   CALCIUM 8.5 (L) 11/23/2019 0357   PROT 6.6 11/20/2019 2207   ALBUMIN 4.0 11/20/2019 2207   AST 19 11/20/2019 2207   ALT 24 11/20/2019 2207   ALKPHOS 61 11/20/2019 2207  BILITOT 0.8 11/20/2019 2207   GFRNONAA >60 11/23/2019 0357   GFRAA >60 11/23/2019 0357   No results Butler for: "CHOL", "HDL", "LDLCALC", "LDLDIRECT", "TRIG", "CHOLHDL" Lab Results  Component Value Date   HGBA1C 5.7 (H) 07/14/2014   No results Butler for: "VITAMINB12" No results Butler for: "TSH"  10/28/14 MRI brain  - Normal MRI of the brain with contrast with special attention to the posterior fossa.  11/21/19 MRI brain  - Unremarkable MRI appearance of the brain for age. No evidence of acute intracranial abnormality. - Mild ethmoid sinus mucosal thickening. - Small right mastoid effusion.  03/17/20 MRI cervical spine (without) demonstrating: - At C5-6 uncovertebral joint and facet hypertrophy with moderate right and mild left foraminal stenosis. - At C6-7 uncovertebral joint and facet hypertrophy with moderate bilateral foraminal stenosis. - At C7-T1 uncovertebral joint and facet hypertrophy with moderate right and mild left foraminal stenosis. - At C4-5 uncovertebral joint hypertrophy with mild right foraminal stenosis. - Note enumeration scheme.  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  Rudimentary C2-3 intervertebral disc space.  Correlate with plain film x-rays if  intervention or surgery is considered.    ASSESSMENT AND PLAN  47 y.o. year old male here with:   Dx:  1. Intractable migraine with aura without status migrainosus   2. Cervicalgia   3. Vertigo   4. Neck swelling   5. Swelling of right side of face     PLAN:  RIGHT EAR PAIN, FACIAL PAIN, NECK AND SHOULDER PAIN (since ~2016) - unclear etiology; tried and failed numerous medications - check MRI face / trigeminal and neck (rule out soft tissue etiologies; also with progressive right facial / parotid swelling, neck pain and swelling) - trial of cymbalta 30mg  daily - follow up with pain mgmt  MENIERE'S DISEASE (vertigo, hearing loss, tinnitus) - diazepam 5mg  daily as needed + ondansetron as needed  INSOMNIA (related to chronic pain) - sleep hygiene reviewed; consider psychiatry / psychology evaluation  SLEEP APNEA - continue CPAP (via Dr. Phillips Odor office)  Orders Placed This Encounter  Procedures   MR NECK SOFT TISSUE ONLY W WO CONTRAST   MR FACE/TRIGEMINAL WO/W CM   Meds ordered this encounter  Medications   diazepam (VALIUM) 5 MG tablet    Sig: Take 1 tablet (5 mg total) by mouth daily as needed (for severe vertigo).    Dispense:  15 tablet    Refill:  3   ondansetron (ZOFRAN-ODT) 8 MG disintegrating tablet    Sig: Take 1 tablet (8 mg total) by mouth every 8 (eight) hours as needed for nausea or vomiting.    Dispense:  30 tablet    Refill:  3   DULoxetine (CYMBALTA) 30 MG capsule    Sig: Take 1 capsule (30 mg total) by mouth daily.    Dispense:  30 capsule    Refill:  6   Return in about 6 months (around 09/19/2023) for MyChart visit (15 min).    Suanne Marker, MD 03/21/2023, 3:20 PM Certified in Neurology, Neurophysiology and Neuroimaging  [facility] [address], [address] [place], [place] [address] [phone]

## 2023-03-30 ENCOUNTER — Telehealth: Payer: Self-pay | Admitting: Diagnostic Neuroimaging

## 2023-03-30 NOTE — Telephone Encounter (Signed)
[employer] Berkley Harvey: [id number] exp. 03/30/23-04/28/23 sent to [facility] [phone]

## 2023-04-05 ENCOUNTER — Ambulatory Visit (HOSPITAL_COMMUNITY): Payer: BC Managed Care – PPO

## 2023-04-05 ENCOUNTER — Other Ambulatory Visit (HOSPITAL_COMMUNITY): Payer: BC Managed Care – PPO

## 2023-04-10 ENCOUNTER — Ambulatory Visit: Payer: BC Managed Care – PPO | Admitting: Diagnostic Neuroimaging

## 2023-04-17 ENCOUNTER — Ambulatory Visit: Payer: BC Managed Care – PPO | Admitting: Psychiatry

## 2023-06-08 ENCOUNTER — Encounter: Payer: Self-pay | Admitting: Diagnostic Neuroimaging

## 2023-06-14 MED ORDER — CYCLOBENZAPRINE HCL 10 MG PO TABS: 10.0000 mg | ORAL_TABLET | Freq: Two times a day (BID) | ORAL | 0 refills | Status: AC

## 2023-06-14 NOTE — Telephone Encounter (Signed)
Spoke to pt informed him per Dr Levada Dy ok send 1 month refill and than return to PCP for further refills Pt expressed understanding and thanked me for calling

## 2023-09-18 ENCOUNTER — Encounter: Payer: Self-pay | Admitting: Diagnostic Neuroimaging

## 2023-09-18 ENCOUNTER — Telehealth (INDEPENDENT_AMBULATORY_CARE_PROVIDER_SITE_OTHER): Payer: BC Managed Care – PPO | Admitting: Diagnostic Neuroimaging

## 2023-09-18 ENCOUNTER — Telehealth: Payer: Self-pay | Admitting: Diagnostic Neuroimaging

## 2023-09-18 DIAGNOSIS — R519 Headache, unspecified: Secondary | ICD-10-CM | POA: Diagnosis not present

## 2023-09-18 NOTE — Telephone Encounter (Signed)
Referral for neurosurgery fax to [facility] to see Dr. Salomon Fick. Phone: [phone], Fax: [phone]

## 2023-09-18 NOTE — Progress Notes (Signed)
 [facility]  PATIENT: Francis Butler. DOB: 10-Sep-1975  REFERRING CLINICIAN: Minus Amel, MD HISTORY FROM: patient  REASON FOR VISIT: follow up   HISTORICAL  CHIEF COMPLAINT:  Chief Complaint  Patient presents with   right facial pain    HISTORY OF PRESENT ILLNESS:   UPDATE (09/18/23, VRP): Since last visit, doing about the same; couldn't get MRIs due to cost.   UPDATE (03/21/23, VRP): Since last visit, doing about the same. CHRONIC right ear, face, neck pain, swelling since 2016. Chronic vertigo, nausea. Sleeping 3-4 hours per night. Having memory difficulty. Diazepam  helped in the past.   PRIOR HPI (Dr. Billy Bue, 08/17/22):  48 year old male with a history of asthma, DM, HTN, ?meniere's disease, OSA who follows in clinic for vertigo, headaches, and neck pain which began in 2016.  At his last visit he was started on Imitrex  and Qulipta . He continues to have headaches and vertigo 3-4 times per week. Vertigo is severe enough to make him vomit every 2 weeks. Zofran  is inconsistently effective for his nausea. Imitrex  works about the same as Maxalt  and also makes him drowsy. Did not pick up Qulipta .   Physical therapy has been helping somewhat. He has been doing dry needling for his neck/upper back pain which helps temporarily but pain returns soon after his sessions are complete. He would prefer not to add any new medications for pain.   Prior Therapies                                  Preventive: Lyrica  Lamictal  Gabapentin  Verapamil  nortriptyline Topamax metoprolol Indomethacin  Ajovy Botox  HCTZ Zoloft  Qulipta    Rescue: Flexeril  Zofran  diazepam  Meclizine  Maxalt  Imitrex    REVIEW OF SYSTEMS: Full 14 system review of systems performed and negative with exception of: AS PER HPI.  ALLERGIES: Allergies  Allergen Reactions   Bee Pollen Swelling   Penicillins Hives   Thorazine  [Chlorpromazine ] Other (See Comments)    "I was unable to wake up after  taking the medication. My parents tried to wake me several times.  They reported I lost color and was extremely lethargic"   Scopolamine Other (See Comments)    Pupils to dilate    HOME MEDICATIONS: Outpatient Medications Prior to Visit  Medication Sig Dispense Refill   albuterol (VENTOLIN HFA) 108 (90 Base) MCG/ACT inhaler Inhale into the lungs every 6 (six) hours as needed for wheezing or shortness of breath.     amLODipine (NORVASC) 10 MG tablet Take 10 mg by mouth daily.     cyclobenzaprine  (FLEXERIL ) 10 MG tablet Take 1 tablet (10 mg total) by mouth in the morning and at bedtime. 60 tablet 0   diazepam  (VALIUM ) 5 MG tablet Take 1 tablet (5 mg total) by mouth daily as needed (for severe vertigo). 15 tablet 3   metFORMIN (GLUCOPHAGE) 500 MG tablet Take 500 mg by mouth 2 (two) times daily.     montelukast (SINGULAIR) 10 MG tablet Take 10 mg by mouth at bedtime.     olmesartan-hydrochlorothiazide (BENICAR HCT) 40-25 MG tablet Take 1 tablet by mouth daily.     omeprazole (PRILOSEC) 20 MG capsule Take 20 mg by mouth daily.     ondansetron  (ZOFRAN -ODT) 8 MG disintegrating tablet Take 1 tablet (8 mg total) by mouth every 8 (eight) hours as needed for nausea or vomiting. 30 tablet 3   DULoxetine  (CYMBALTA ) 30 MG capsule Take 1 capsule (30  mg total) by mouth daily. 30 capsule 6   No facility-administered medications prior to visit.      PHYSICAL EXAM  GENERAL EXAM/CONSTITUTIONAL: Vitals:  There were no vitals filed for this visit.  There is no height or weight on file to calculate BMI. Wt Readings from Last 3 Encounters:  03/21/23 245 lb (111.1 kg)  08/17/22 239 lb 8 oz (108.6 kg)  01/17/22 250 lb 6.4 oz (113.6 kg)   Patient is in no distress; well developed, nourished and groomed; neck is supple  CARDIOVASCULAR: Examination of carotid arteries is normal; no carotid bruits Regular rate and rhythm, no murmurs Examination of peripheral vascular system by observation and palpation is  normal  EYES: Ophthalmoscopic exam of optic discs and posterior segments is normal; no papilledema or hemorrhages No results found.  MUSCULOSKELETAL: Gait, strength, tone, movements noted in Neurologic exam below  NEUROLOGIC: MENTAL STATUS:      No data to display         awake, alert, oriented to person, place and time recent and remote memory intact normal attention and concentration language fluent, comprehension intact, naming intact fund of knowledge appropriate  CRANIAL NERVE:  2nd - no papilledema on fundoscopic exam 2nd, 3rd, 4th, 6th - pupils equal and reactive to light, visual fields full to confrontation, extraocular muscles intact, no nystagmus 5th - facial sensation symmetric 7th - facial strength symmetric --> SWELLING IN RIGHT FACE, PAROTID, NECK REGION 8th - hearing intact 9th - palate elevates symmetrically, uvula midline 11th - shoulder shrug symmetric 12th - tongue protrusion midline  MOTOR:  normal bulk and tone, full strength in the BUE, BLE  SENSORY:  normal and symmetric to light touch, temperature, vibration  COORDINATION:  finger-nose-finger, fine finger movements normal  REFLEXES:  deep tendon reflexes TRACE and symmetric  GAIT/STATION:  narrow based gait     DIAGNOSTIC DATA (LABS, IMAGING, TESTING) - I reviewed patient records, labs, notes, testing and imaging myself where available.  Lab Results  Component Value Date   WBC 8.5 11/23/2019   HGB 14.5 11/23/2019   HCT 43.8 11/23/2019   MCV 87.3 11/23/2019   PLT 232 11/23/2019      Component Value Date/Time   NA 140 11/23/2019 0357   K 4.6 11/23/2019 0357   CL 107 11/23/2019 0357   CO2 22 11/23/2019 0357   GLUCOSE 108 (H) 11/23/2019 0357   BUN 9 11/23/2019 0357   CREATININE 1.26 (H) 11/23/2019 0357   CALCIUM 8.5 (L) 11/23/2019 0357   PROT 6.6 11/20/2019 2207   ALBUMIN 4.0 11/20/2019 2207   AST 19 11/20/2019 2207   ALT 24 11/20/2019 2207   ALKPHOS 61 11/20/2019 2207    BILITOT 0.8 11/20/2019 2207   GFRNONAA >60 11/23/2019 0357   GFRAA >60 11/23/2019 0357   No results found for: "CHOL", "HDL", "LDLCALC", "LDLDIRECT", "TRIG", "CHOLHDL" Lab Results  Component Value Date   HGBA1C 5.7 (H) 07/14/2014   No results found for: "VITAMINB12" No results found for: "TSH"  08/03/17 CT soft tissue neck - No etiology is identified for the patient's reported right neck swelling. Specifically, no evidence of mass or cellulitic change. No adenopathy.  - Postsurgical changes of reported right endolymphatic sac decompression.   10/28/14 MRI brain  - Normal MRI of the brain with contrast with special attention to the posterior fossa.  11/21/19 MRI brain  - Unremarkable MRI appearance of the brain for age. No evidence of acute intracranial abnormality. - Mild ethmoid sinus mucosal thickening. -  Small right mastoid effusion.  03/17/20 MRI cervical spine (without) demonstrating: - At C5-6 uncovertebral joint and facet hypertrophy with moderate right and mild left foraminal stenosis. - At C6-7 uncovertebral joint and facet hypertrophy with moderate bilateral foraminal stenosis. - At C7-T1 uncovertebral joint and facet hypertrophy with moderate right and mild left foraminal stenosis. - At C4-5 uncovertebral joint hypertrophy with mild right foraminal stenosis. - Note enumeration scheme.  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  Rudimentary C2-3 intervertebral disc space.  Correlate with plain film x-rays if intervention or surgery is considered.    ASSESSMENT AND PLAN  48 y.o. year old male here with:  Meds tried:  Lyrica , Lamictal , Gabapentin , Verapamil , nortriptyline, Topamax, metoprolol, Indomethacin , Ajovy, Botox , hydrochlorothiazide, Zoloft , Qulipta , Flexeril , Zofran , diazepam , Meclizine , Maxalt , Imitrex    Dx:  1. Right sided facial pain      PLAN:  RIGHT EAR PAIN, FACIAL PAIN, NECK AND SHOULDER PAIN (since ~2016) - unclear etiology; tried and  failed numerous medications - repeat MRI face / trigeminal and neck (rule out soft tissue etiologies; also with progressive right facial / parotid swelling, neck pain and swelling) --> not done due to cost - refer for neurosurgery eval and tx of right facial pain (Dr. Melda Spore) - stop cymbalta  30mg  daily; not effective - follow up with pain mgmt  MENIERE'S DISEASE (vertigo, hearing loss, tinnitus) - diazepam  5mg  daily as needed + ondansetron  as needed - follow up with ENT  INSOMNIA (related to chronic pain) - sleep hygiene reviewed; consider psychiatry / psychology evaluation  SLEEP APNEA - continue CPAP (via Dr. Glady Laming office)  Orders Placed This Encounter  Procedures   Ambulatory referral to Neurosurgery   Return for pending if symptoms worsen or fail to improve, pending test results.  Virtual Visit via Video Note  I connected with Francis Butler. on 09/18/23 at  1:45 PM EDT by a video enabled telemedicine application and verified that I am speaking with the correct person using two identifiers.   I discussed the limitations of evaluation and management by telemedicine and the availability of in person appointments. The patient expressed understanding and agreed to proceed.  Patient is at home and I am at the office.   I spent 20 minutes of face-to-face and non-face-to-face time with patient.  This included previsit chart review, lab review, study review, order entry, electronic health record documentation, patient education.      Omega Bible, MD 09/18/2023, 2:08 PM Certified in Neurology, Neurophysiology and Neuroimaging  [facility] [address] [place], [place] [address] [phone]

## 2024-03-20 ENCOUNTER — Encounter: Payer: Self-pay | Admitting: *Deleted

## 2024-05-13 ENCOUNTER — Other Ambulatory Visit: Payer: Self-pay | Admitting: Diagnostic Neuroimaging

## 2024-05-13 NOTE — Telephone Encounter (Signed)
 Last refilled by patient on : 03/08/24 Last office visit : 09/18/23 Next office visit : Return for pending if symptoms worsen or fail to improve, pending test results. Next appointment has not been sheduled  Per last office note : continue ondansetron  as needed

## 2024-06-06 ENCOUNTER — Telehealth: Payer: Self-pay

## 2024-06-06 NOTE — Telephone Encounter (Signed)
 Physical Therapy Reevaluation form has been signed by Dr. Margaret and faxed back to [facility] on 06/06/24
